# Patient Record
Sex: Female | Born: 1960 | Race: White | Hispanic: No | Marital: Married | Smoking: Never smoker
Health system: Southern US, Community
[De-identification: ages and names within clinical notes are randomized; demographics above are authoritative.]

## PROBLEM LIST (undated history)

## (undated) ENCOUNTER — Ambulatory Visit: Disposition: A | Discharge status: Home / Self Care | Payer: 59

## (undated) DIAGNOSIS — F419 Anxiety disorder, unspecified: Secondary | ICD-10-CM

## (undated) DIAGNOSIS — K219 Gastro-esophageal reflux disease without esophagitis: Secondary | ICD-10-CM

## (undated) DIAGNOSIS — L409 Psoriasis, unspecified: Secondary | ICD-10-CM

## (undated) DIAGNOSIS — M797 Fibromyalgia: Secondary | ICD-10-CM

## (undated) DIAGNOSIS — N939 Abnormal uterine and vaginal bleeding, unspecified: Secondary | ICD-10-CM

## (undated) DIAGNOSIS — R42 Dizziness and giddiness: Secondary | ICD-10-CM

## (undated) DIAGNOSIS — Z86018 Personal history of other benign neoplasm: Secondary | ICD-10-CM

## (undated) HISTORY — PX: TUBAL LIGATION: SHX77

## (undated) HISTORY — DX: Fibromyalgia: M79.7

## (undated) HISTORY — PX: DILATION AND CURETTAGE OF UTERUS: SHX78

## (undated) HISTORY — PX: WISDOM TOOTH EXTRACTION: SHX21

## (undated) HISTORY — PX: CHOLECYSTECTOMY: SHX55

## (undated) HISTORY — PX: HERNIA REPAIR: SHX51

## (undated) HISTORY — DX: Anxiety disorder, unspecified: F41.9

## (undated) HISTORY — DX: Abnormal uterine and vaginal bleeding, unspecified: N93.9

## (undated) HISTORY — DX: Psoriasis, unspecified: L40.9

## (undated) HISTORY — PX: TONSILLECTOMY: SUR1361

## (undated) HISTORY — PX: FOOT SURGERY: SHX648

## (undated) HISTORY — PX: BIOPSY ENDOMETRIAL: PRO11

## (undated) HISTORY — PX: ANKLE SURGERY: SHX546

## (undated) HISTORY — DX: Personal history of other benign neoplasm: Z86.018

## (undated) HISTORY — DX: Gastro-esophageal reflux disease without esophagitis: K21.9

---

## 2003-12-23 ENCOUNTER — Emergency Department: Payer: Self-pay | Admitting: Emergency Medicine

## 2005-10-13 IMAGING — CT CT ABD-PELV W/O CM
1 of 3 series · 16 of 32 positions shown, 20 images · non-contrast
Comparison: none

REASON FOR EXAM: (1) stone protocol  abd pain; (2) abd pain
COMMENTS:

[Series 3: inspace · axial · 0.59mm/px · z∈[+66,+460]mm · 16 of 615 slices shown, 20 images]
[im 26/615  soft-tissue]
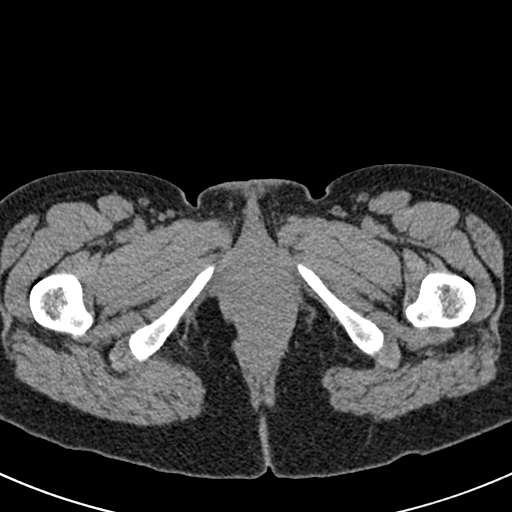
[im 26/615  bone]
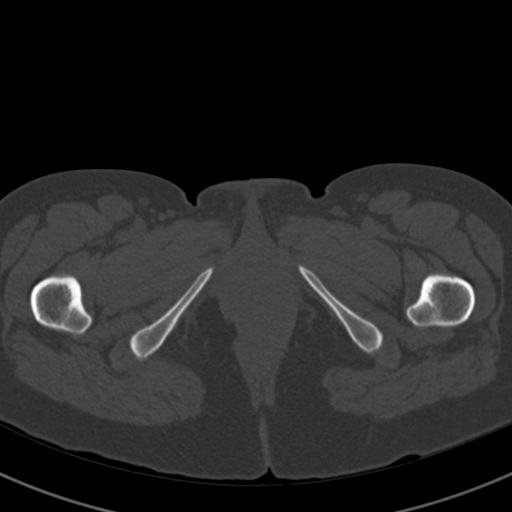
[im 77/615  soft-tissue]
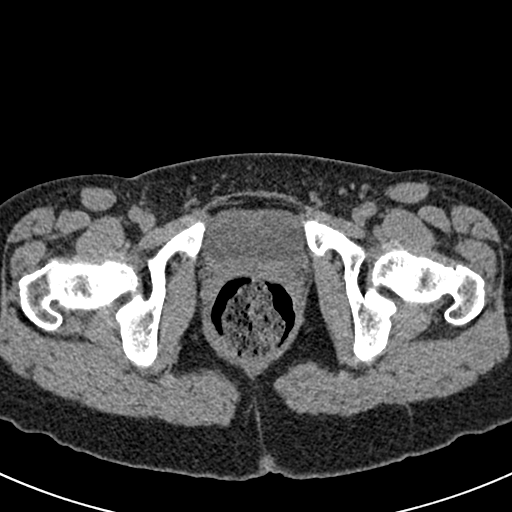
[im 128/615  soft-tissue]
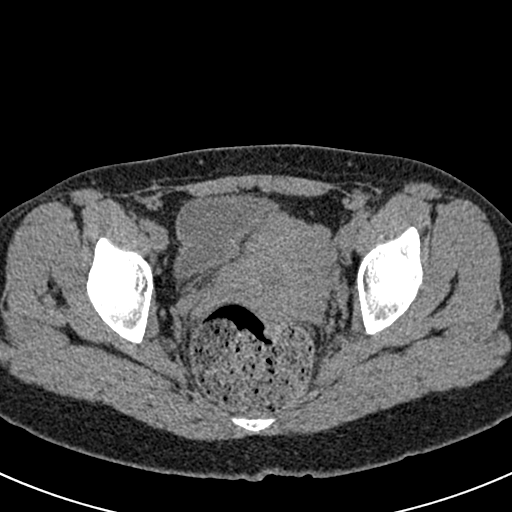
[im 154/615  soft-tissue]
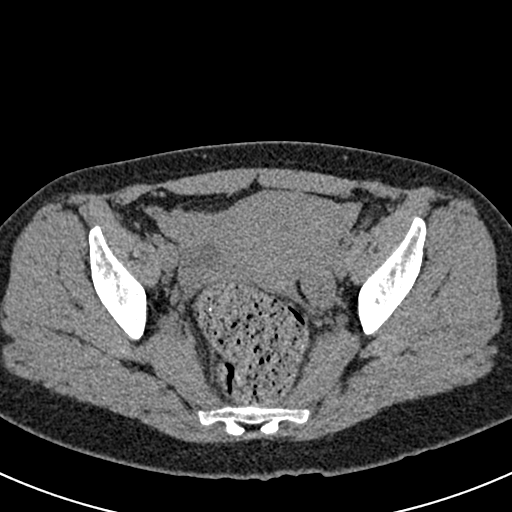
[im 205/615  soft-tissue]
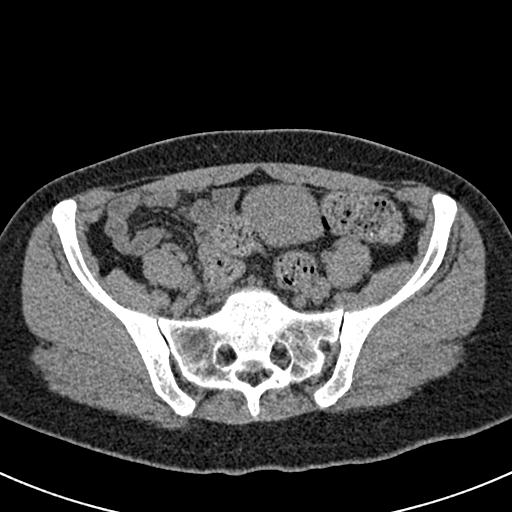
[im 256/615  soft-tissue]
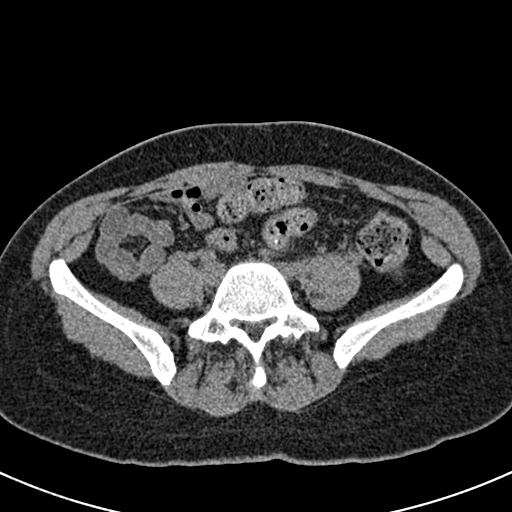
[im 282/615  soft-tissue]
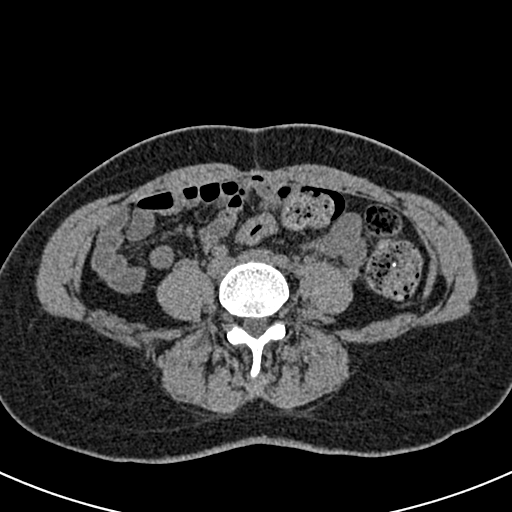
[im 333/615  soft-tissue]
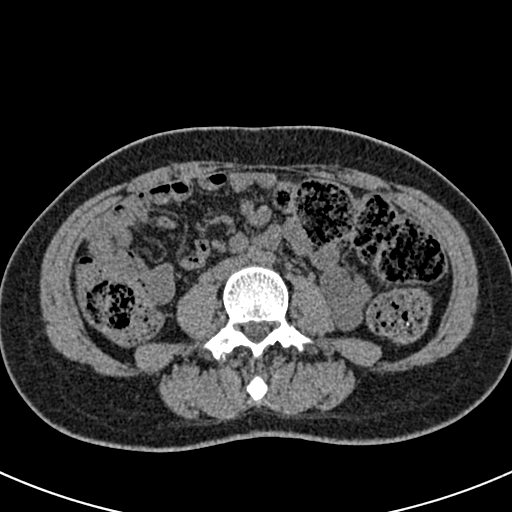
[im 359/615  soft-tissue]
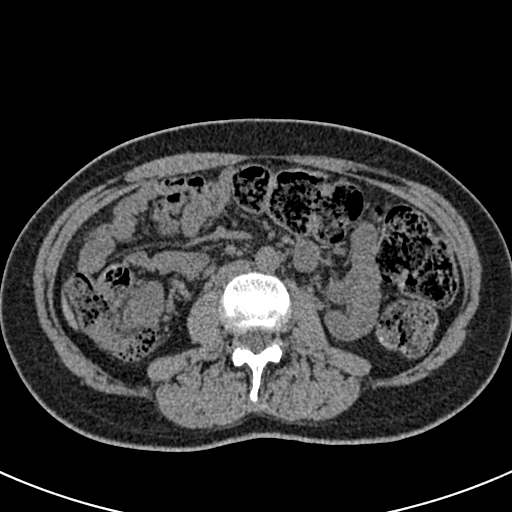
[im 359/615  bone]
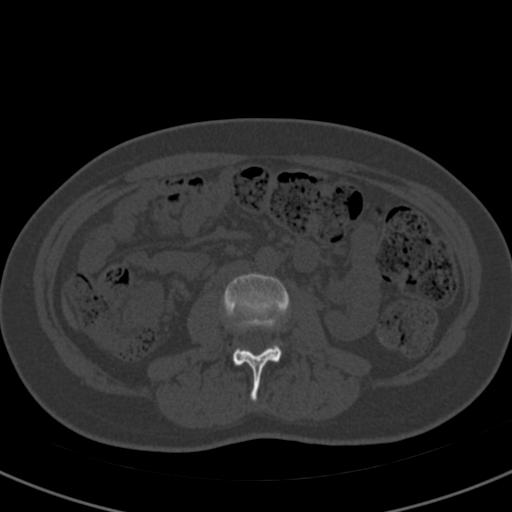
[im 410/615  soft-tissue]
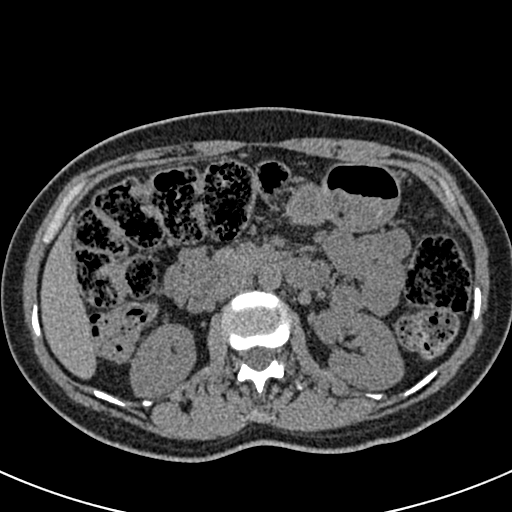
[im 461/615  soft-tissue]
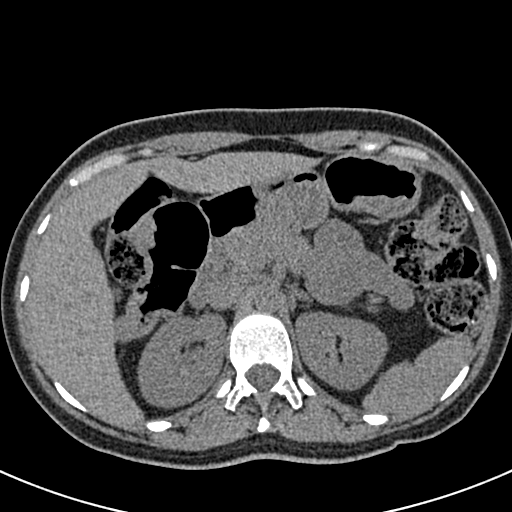
[im 487/615  soft-tissue]
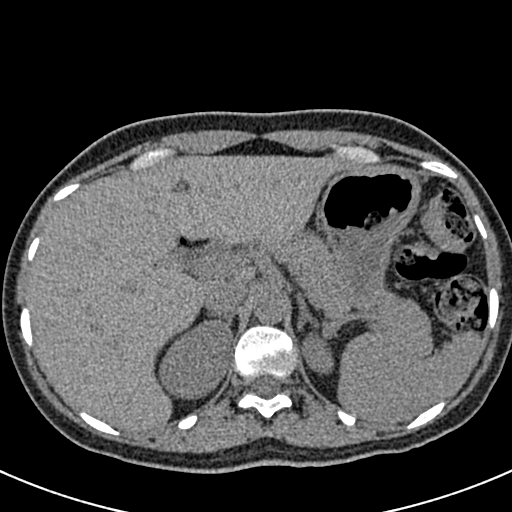
[im 512/615  lung]
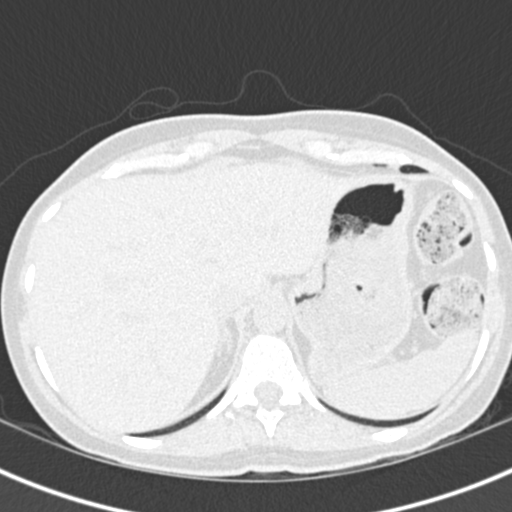
[im 538/615  soft-tissue]
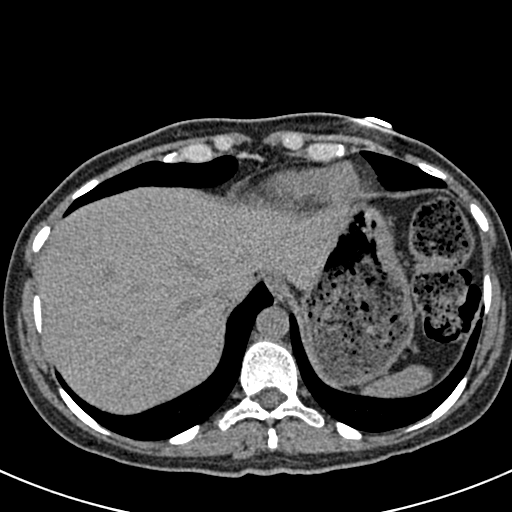
[im 538/615  lung]
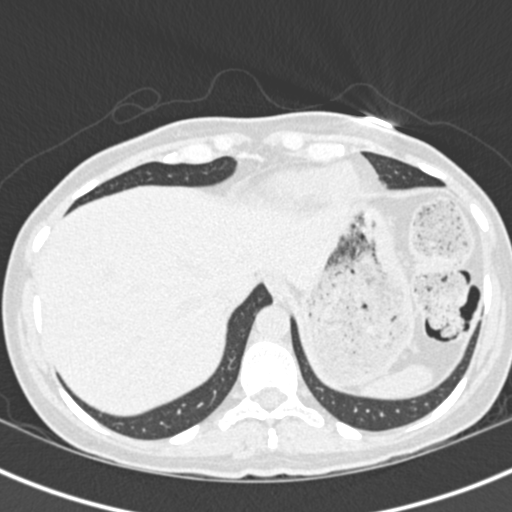
[im 563/615  lung]
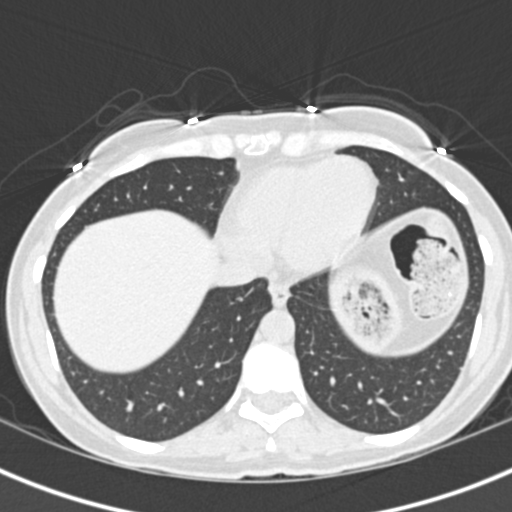
[im 589/615  soft-tissue]
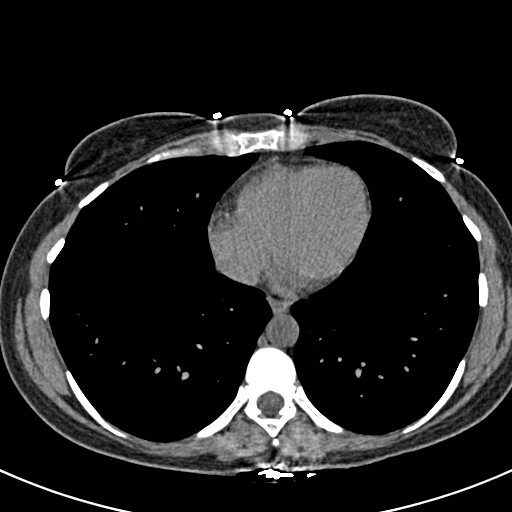
[im 589/615  lung]
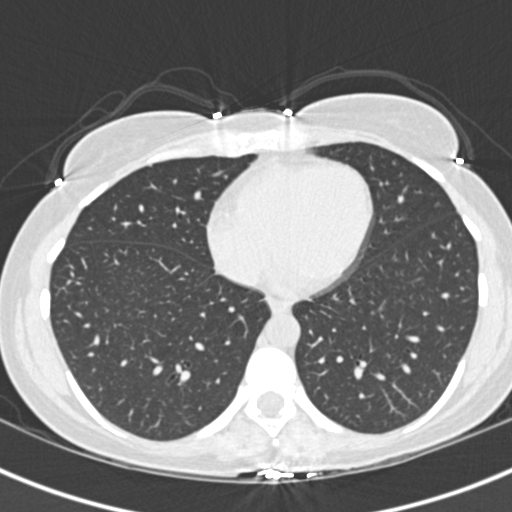

[16 of 32 positions shown; findings below may reference images not displayed]

PROCEDURE:     CT  - CT ABDOMEN AND PELVIS W[DATE]  [DATE]

RESULT:     Non-contrast emergent CT scan of the abdomen and pelvis is
performed. No radiopaque renal stones or obstructive changes are seen. There
is a moderate amount of retained stool and there may be an element of
constipation present. No definite obstruction is seen.  No free fluid or
free air is evident.  Phleboliths are seen within the pelvic region. The
appendix appears to be grossly normal.
IMPRESSION: 1)No urinary tract stones or obstructive change is seen.

2)Mild constipation.
The findings were phoned to the [HOSPITAL] the completion of
the study.

## 2006-08-04 ENCOUNTER — Ambulatory Visit: Payer: Self-pay | Admitting: Obstetrics & Gynecology

## 2006-08-04 ENCOUNTER — Encounter (INDEPENDENT_AMBULATORY_CARE_PROVIDER_SITE_OTHER): Payer: Self-pay | Admitting: Gynecology

## 2007-12-19 ENCOUNTER — Ambulatory Visit: Payer: Self-pay | Admitting: Nurse Practitioner

## 2007-12-19 ENCOUNTER — Encounter (INDEPENDENT_AMBULATORY_CARE_PROVIDER_SITE_OTHER): Payer: Self-pay | Admitting: Gynecology

## 2007-12-19 LAB — CONVERTED CEMR LAB
HCT: 44.1 % (ref 36.0–46.0)
HDL: 53 mg/dL (ref >39)
MCV: 97.8 fL (ref 78.0–100.0)
RBC: 4.51 M/uL (ref 3.87–5.11)
TSH: 1.869 microintl units/mL (ref 0.350–4.50)

## 2007-12-28 ENCOUNTER — Ambulatory Visit: Payer: Self-pay | Admitting: Family Medicine

## 2007-12-28 ENCOUNTER — Encounter: Payer: Self-pay | Admitting: Family Medicine

## 2009-02-22 DIAGNOSIS — Z86018 Personal history of other benign neoplasm: Secondary | ICD-10-CM

## 2009-02-22 HISTORY — DX: Personal history of other benign neoplasm: Z86.018

## 2009-03-12 ENCOUNTER — Ambulatory Visit: Payer: Self-pay | Admitting: Family Medicine

## 2009-03-12 LAB — CONVERTED CEMR LAB: TSH: 2.088 microintl units/mL (ref 0.350–4.500)

## 2009-03-20 ENCOUNTER — Ambulatory Visit (HOSPITAL_COMMUNITY): Admission: RE | Admit: 2009-03-20 | Discharge: 2009-03-20 | Payer: Self-pay | Admitting: Obstetrics & Gynecology

## 2009-04-09 ENCOUNTER — Ambulatory Visit: Payer: Self-pay | Admitting: Family Medicine

## 2009-11-05 ENCOUNTER — Ambulatory Visit: Payer: Self-pay | Admitting: Obstetrics and Gynecology

## 2010-07-07 NOTE — Assessment & Plan Note (Signed)
NAME:  Kathy Barnes, Kathy Barnes                 ACCOUNT NO.:  [id number]   MEDICAL RECORD NO.:  [id number]          PATIENT TYPE:  POB   LOCATION:  [facility]         FACILITY:  [facility]   PHYSICIAN:  Tinnie Gens, MD        DATE OF BIRTH:  11/03/1960   DATE OF SERVICE:  04/09/2009                                  CLINIC NOTE   CHIEF COMPLAINT:  Endometrial sampling.   HISTORY OF PRESENT ILLNESS:  The patient is a 50 year old gravida 4,  para 3-0-1-3, who came in for yearly exam in January.  She reported some  heavy bleeding at that time.  She underwent sonography which revealed a  small fibroid uterus, the biggest of which was 1.5 cm.  She also  underwent Pap smear and test which was normal and laboratory testing  which showed her to have a normal TSH and FSH.  She came in today to  discuss these results as well as to talk about endometrial sampling  which she was thinking may be due to some fibroids on ultrasound.  However, I do still think this is indicative given her age.  Additionally, the patient is complaining of fever and chills.  She saw  her primary care physician last week and had negative flu test and was  treated with flu meds, though she continues to have daily fevers,  chills, sinus headache and pressure, postnasal drip, and sore throat.  She thinks she has a sinus infection, multiple headaches with sinus  pressure seemed to point in that direction.   PHYSICAL EXAMINATION:  VITAL SIGNS:  Today, her vitals are as noted in  the chart.  GENERAL:  She is a well-developed, well-nourished female in no acute  distress.  ABDOMEN:  Soft, nontender, nondistended.  GU:  Normal external female genitalia.  BUS normal.  Vagina is pink and  rugated.  Cervix is parous without lesion.   PROCEDURE:  The cervix was grasped anteriorly with a single-tooth  tenaculum, it was cleaned with Betadine x2.  A Pipelle was used to sound  a 10-cm or 9.5 cm uterus.  Endometrial sampling was undertaken  without  difficulty.  The patient tolerated the procedure well.   IMPRESSION AND PLAN:  Abnormal uterine bleeding and acute sinusitis, Z-  Pak.  Followup results in 2 weeks.  The patient was also given  information about fibroid uterus and what if anything needs to be done  about that.           ______________________________  Tinnie Gens, MD     TP/MEDQ  D:  04/09/2009  T:  04/10/2009  Job:  [id number]

## 2010-07-07 NOTE — Assessment & Plan Note (Signed)
NAME:  Kathy Barnes, Kathy Barnes                 ACCOUNT NO.:  [id number]   MEDICAL RECORD NO.:  [id number]          PATIENT TYPE:  POB   LOCATION:  [facility]         FACILITY:  [facility]   PHYSICIAN:  Tinnie Gens, MD        DATE OF BIRTH:  1960/05/13   DATE OF SERVICE:  03/12/2009                                  CLINIC NOTE   CHIEF COMPLAINT:  Physical exam.   HISTORY OF PRESENT ILLNESS:  The patient is a 50 year old gravida 4,  para 3-0-1-3 status post tubal ligation who comes in today for annual  exam.  Her last mammogram was on February 11, 2009, and was BIRADS 1 and  negative.  She reports a 12-month history of heavier cycles with blood  clots of unclear etiology.  The cycles may be shorter in duration, but  are still regular.  She did miss her period approximately 2 months ago  and had 2 weeks of hot flashes, but those have resolved now.  Additionally, the patient has put on approximately 7 pounds, although  she has not been doing her spin classes regularly.  If she wants the  lessons she should increase her coffee drinks, which she is trying to  cut back on now.  The patient otherwise is without significant  complaint.   PAST MEDICAL HISTORY:  Fibromyalgia.   PAST SURGICAL HISTORY:  1. BTL.  2. Cholecystectomy.  3. Hernia repair.  4. Tonsillectomy.  5. Ankle surgery.   ALLERGIES:  None known.  No latex allergy.   MEDICATIONS:  Nortriptyline 25 mg p.o. daily.   OBSTETRICAL HISTORY:  G4, P3, SVD x3, SAB x1.   GYNECOLOGIC HISTORY:  History of BTL.  No history of abnormal Pap  smears.   FAMILY HISTORY:  Renal cancer, hypertension, diabetes, coronary artery  disease, and stroke.  She thinks her father had some sort of breast  tumor removed in his early 53s.  If this was a cancer, she would be a  candidate for BRCA testing.  She has no other family history of breast  or ovarian cancer.  Sister has a history of having had hysterectomy for  a large fibroid uterus.   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  She denies tobacco, alcohol, or drug use.   REVIEW OF SYSTEMS:  The patient denies headache, vision changes, chest  pain, shortness of breath, nausea, vomiting, diarrhea.  She is mildly  constipated which was unusual for her.  No blood in her stool.  No  problems with passing her urine.  GU, no vaginal irritation or discharge  noted.  The patient does have bleeding irregularities as described in  the HPI.   PHYSICAL EXAMINATION:  VITAL SIGNS:  Today, her pulse is 104, blood  pressure is 129/92, weight is 134.  GENERAL:  She is a well-developed, well-nourished female, in no acute  distress.  HEENT:  Normocephalic, atraumatic.  Sclerae anicteric.  NECK:  Supple.  Normal thyroid.  LUNGS:  Clear bilaterally.  CARDIOVASCULAR:  Regular rate and rhythm. No rubs, gallops, or murmurs.  ABDOMEN:  Soft,  nontender, nondistended.  EXTREMITIES:  No cyanosis, clubbing, or edema.  BREASTS:  Symmetric with everted nipples.  No masses.  No  supraclavicular or axillary adenopathy.  GU:  External genitalia, there is fusion of the labia minora in the  superior aspect for this patient.  She has no history of  infundibulation.  She has no erythema noted.  BUS is normal.  Vagina is  pink and rugated.  Cervix is parous without lesion.  Uterus is firm,  possibly retroverted, noted to be 8-10 week in size.  No adnexal masses  or tenderness noted.   IMPRESSION:  1. Yearly exam.  2. Abnormal uterine bleeding.   PLAN:  1. Pap smear today.  2. Check TSH and FSH.  3. Pelvic sonogram.  4. The patient will return in 2-4 weeks for endometrial samplings      given her abnormal uterine bleeding and for rule out of endometrial      hyperplasia or cancer.           ______________________________  Tinnie Gens, MD     TP/MEDQ  D:  03/12/2009  T:  03/13/2009  Job:  [id number]

## 2010-07-07 NOTE — Assessment & Plan Note (Signed)
NAME:  Kathy Barnes, Kathy Barnes                 ACCOUNT NO.:  [id number]   MEDICAL RECORD NO.:  [id number]          PATIENT TYPE:  POB   LOCATION:  [facility]         FACILITY:  [facility]   PHYSICIAN:  Tinnie Gens, MD        DATE OF BIRTH:  [birth year]   DATE OF SERVICE:  12/28/2007                                  CLINIC NOTE   CHIEF COMPLAINT:  Yearly exam.   HISTORY OF PRESENT ILLNESS:  The patient is a 50 year old gravida 4,  para 3-0-1-3 who is status post tubal ligation many years ago.  She  comes here for annual Pap smears.  Her last mammogram was in 2006.  She  continues to have regular cycles.  She is an identical twin.  Her sister  is having hysterectomy for a large fibroid uterus, and she is worried  about this, but she has not been having symptoms related to that.  The  patient has history of fibromyalgia.  She is on nortriptyline, which  controls her symptoms very well.   PAST MEDICAL HISTORY:  Significant for fibromyalgia.   PAST SURGICAL HISTORY:  BTL, cholecystectomy, hernia repair,  tonsillectomy, and ankle surgery.   ALLERGIES:  None known.  No latex allergy.   MEDICATIONS:  Nortriptyline 25 mg p.o. daily.   OBSTETRIC HISTORY:  She is a G4 with 1 SAB, and 3 SVDs.   GYNECOLOGIC HISTORY:  History of BTL.  No history of abnormal Pap  smears.   FAMILY HISTORY:  Significant for renal cancer, hypertension, diabetes,  coronary artery disease, and CVAs.   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  She denies tobacco, alcohol, or drug  use.   PHYSICAL EXAMINATION:  VITAL SIGNS:  Pulse is 100, blood pressure  124/89, weight 127, height 5 feet 2 inches.  GENERAL:  She is a well-developed, well-nourished female in no acute  distress.  HEENT:  Normocephalic, atraumatic.  Sclerae anicteric.  NECK:  Supple.  Normal thyroid.  LUNGS:  Clear bilaterally.  CARDIOVASCULAR:  Regular rate and rhythm.  No rubs, gallops, or  murmurs.  ABDOMEN:  Soft, nontender, nondistended.  BREASTS:  Symmetric with everted nipples.  No masses.  No  supraclavicular or axillary adenopathy.  GU:  Normal external female genitalia.  BUS normal.  Vagina, pink and  rugated.  Cervix is parous and without lesion.  Uterus is anteverted,  approximately 8 weeks' size.  No adnexal mass or tenderness.   LABORATORY DATA:  The patient had lab work done on December 19, 2007.  This is reviewed.  She has a WBC of 5.4, hemoglobin 14.7, platelet count  244, total cholesterol 188, HDL 53, LDL 113, TSH of 1.87.   IMPRESSION:  Yearly examination, doing well.   PLAN:  1. Pap smear today.  2. Referral for mammogram.  3. Colonoscopy in 2 years.           ______________________________  Tinnie Gens, MD     TP/MEDQ  D:  12/28/2007  T:  12/29/2007  Job:  [id number]

## 2010-07-07 NOTE — Assessment & Plan Note (Signed)
NAME:  MIRANDA, FRESE NO.:  [id number]   MEDICAL RECORD NO.:  [id number]          PATIENT TYPE:  POB   LOCATION:  [facility]         FACILITY:  [facility]   PHYSICIAN:  Argentina Donovan, MD        DATE OF BIRTH:  [birth year]   DATE OF SERVICE:  11/05/2009                                  CLINIC NOTE   The patient is a 50 year old Caucasian female, gravida 4, para 3-0-1-3,  who had her yearly exam earlier in the year.  She came in because of  nodules that she felt axillary and also in the area of the labia majora.  Also, she stated she has got some small red raised areas on the vulva.  The patient on examination showed areas that had been shaved both in the  axilla and the labia and had a small subcutaneous nodule close to the  skin in those areas probably sebaceous cysts.  Also, she had 2 small  elevated vesicles of 1-mm in diameter that appeared to be just dilated.  Capillaries were asymptomatic in the area that she had been shaving.  We  talked about shaving and injury to the skin that it causes and that  these things are often secondary to that as are sometimes subcutaneous  abscesses in the sweat glands.  I think she felt comfortable with that.  She also is markedly irregular in her periods now every 2-3 months as  she approaches menopause and often times has hot flashes and symptoms  for awhile and then they disappear and following that her period comes  on she is having certainly irregular ovulations.   IMPRESSION:  Perimenopausal with small benign subcutaneous nodules  probably secondary to her shaving.           ______________________________  Argentina Donovan, MD     PR/MEDQ  D:  11/05/2009  T:  11/06/2009  Job:  [id number]

## 2011-01-09 IMAGING — US US TRANSVAGINAL NON-OB
1 series · 13 of 25 positions shown · non-contrast
Comparison: None.

CLINICAL DATA: Abnormal uterine bleeding with menses becoming
irregular and sometimes heavy.  LMP 03/07/2009.

TRANSABDOMINAL AND TRANSVAGINAL ULTRASOUND OF PELVIS
TECHNIQUE: Both transabdominal and transvaginal ultrasound
examinations of the pelvis were performed including evaluation of
the uterus, ovaries, adnexal regions, and pelvic cul-de-sac.

[Series 1: us transvaginal non-ob · 13 of 55 slices shown]
[im 1/55]
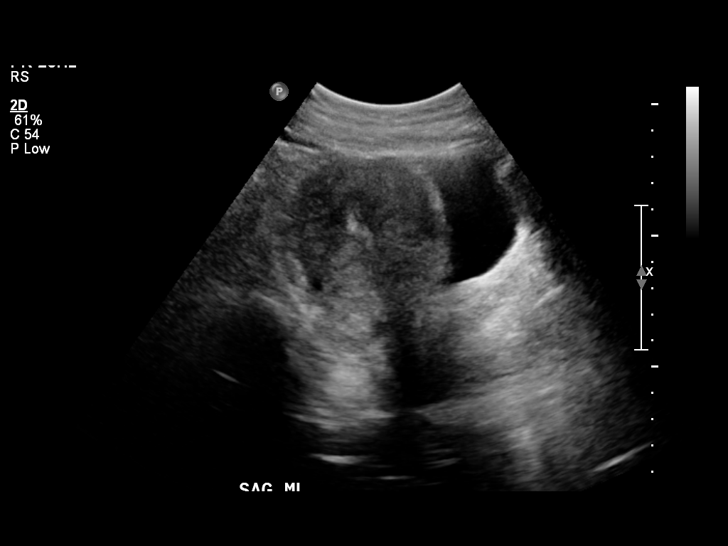
[im 5/55]
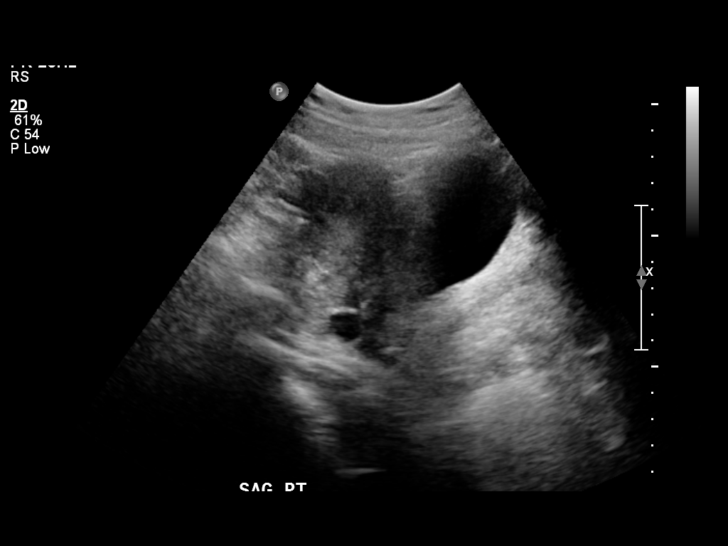
[im 10/55]
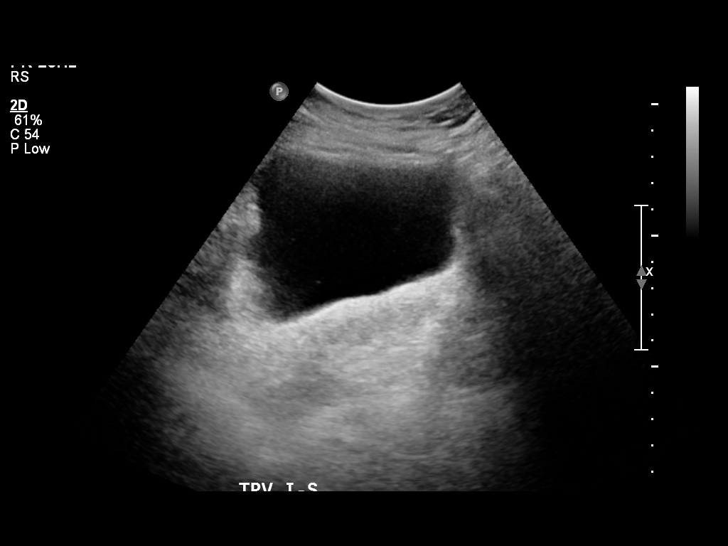
[im 14/55]
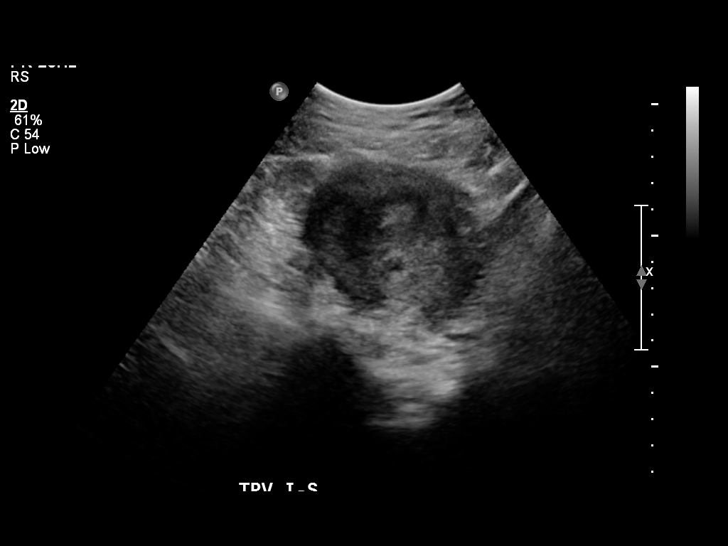
[im 19/55]
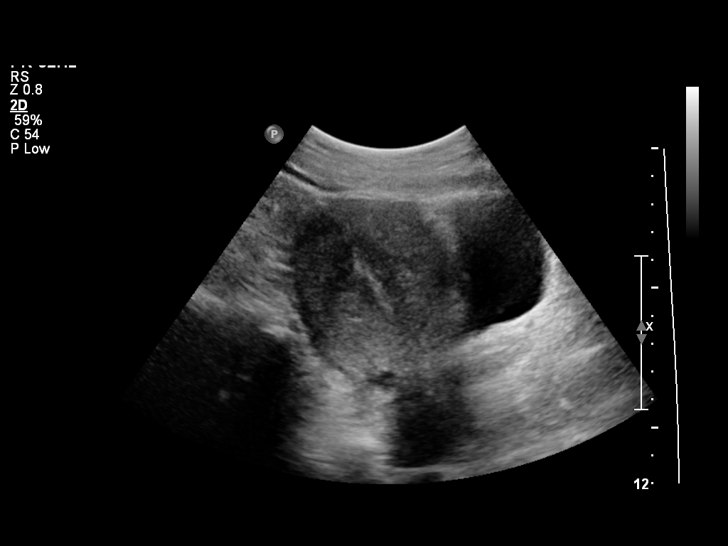
[im 23/55]
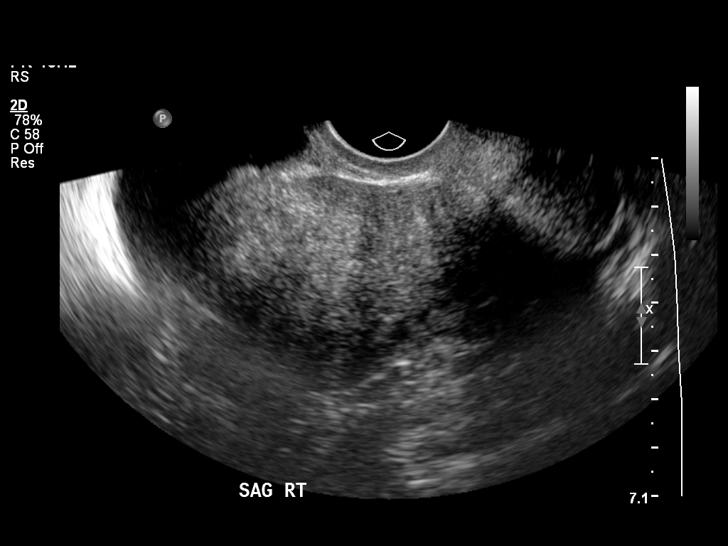
[im 28/55]
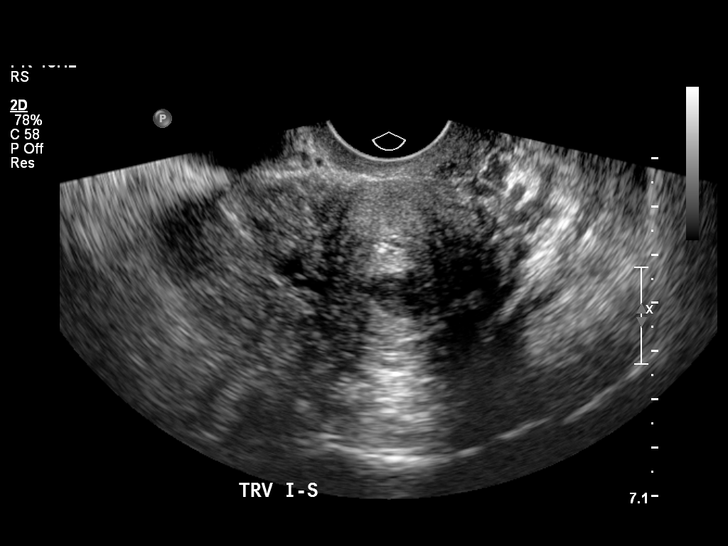
[im 32/55]
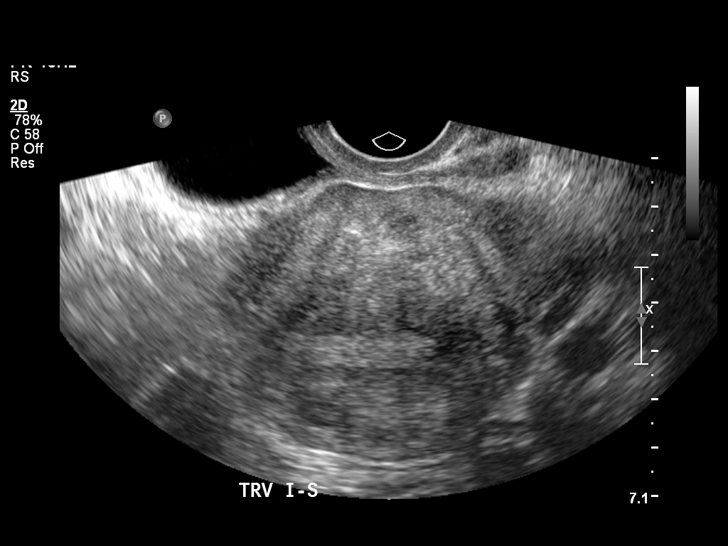
[im 37/55]
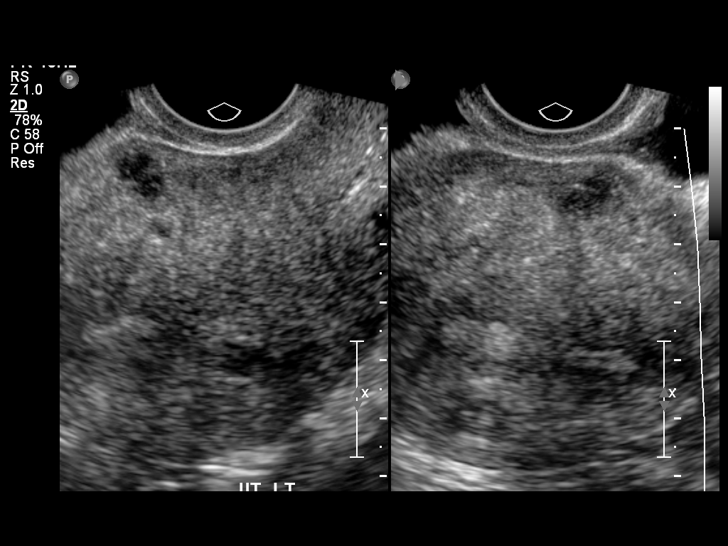
[im 41/55]
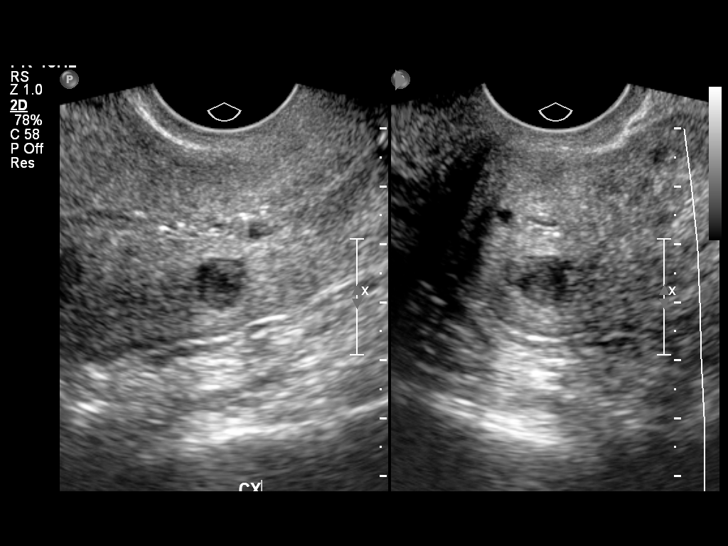
[im 46/55]
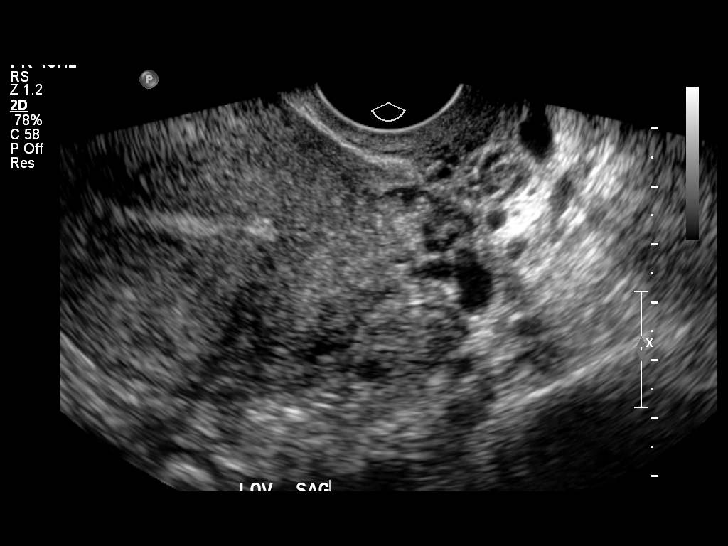
[im 50/55]
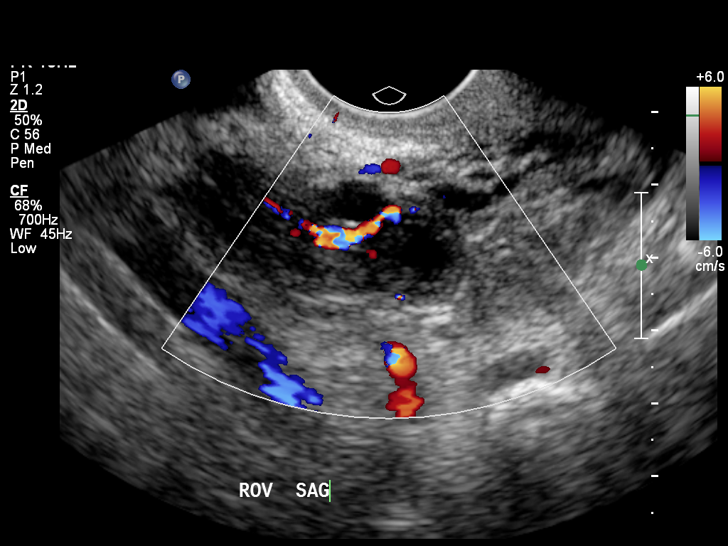
[im 55/55]
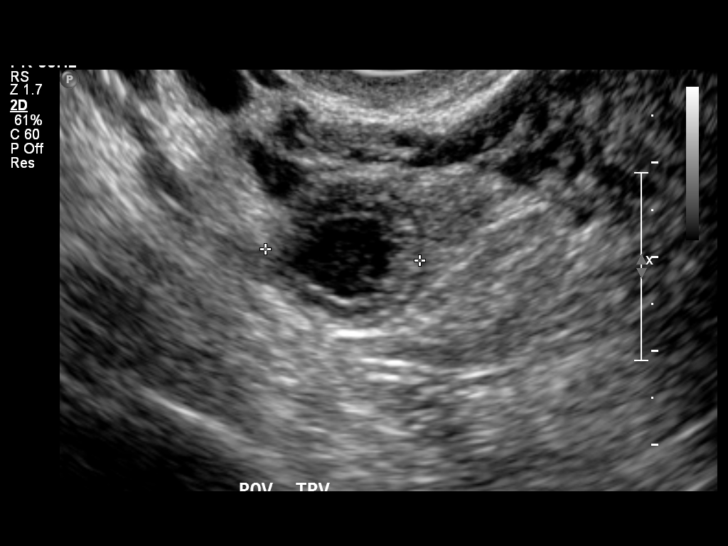

[13 of 25 positions shown; findings below may reference images not displayed]

FINDINGS: Uterus the uterus demonstrates a sagittal length of 9.8 cm, an AP
width of 5.5 cm and a transverse width of 6.8 cm.  There are
several small focal mural fibroids identified measuring between one
and 1.5 cm.  One of these is located in the posterior cervical
region.

Endometrium  is homogeneously echogenic with an AP width of 6.2 mm.
No areas of focal thickening or inhomogeneity are seen.  A tri-
layered appearance is not seen as might be expected with a
periovulatory endometrial stripe and the patient's given LMP of
03/07/2009.

Right Ovary measures 3.4 x 1.7 x 1.9 cm and contains a crenated
small complex cystic area with peripheral flow.  This appearance is
compatible with a corpus luteum

Left Ovary measures 3.4 x 1.7 x 1.9 cm and has a normal appearance

Other Findings:  No pelvic fluid or separate adnexal masses are
seen.
IMPRESSION: Small mural fibroids and one small cervical fibroid noted.
Homogeneously echogenic endometrial stripe with no focal
abnormality seen.  Findings suggestive of a right corpus luteum.
Normal left ovary.  Overall the appearance would be more compatible
with a presecretory pelvic ultrasound than a periovulatory pelvic
ultrasound as might be anticipated with the patient's given LMP of
[DATE].

## 2011-12-23 ENCOUNTER — Other Ambulatory Visit (INDEPENDENT_AMBULATORY_CARE_PROVIDER_SITE_OTHER): Payer: Self-pay | Admitting: *Deleted

## 2011-12-23 DIAGNOSIS — Z23 Encounter for immunization: Secondary | ICD-10-CM

## 2011-12-23 DIAGNOSIS — N912 Amenorrhea, unspecified: Secondary | ICD-10-CM

## 2011-12-23 LAB — CBC WITH DIFFERENTIAL/PLATELET
Basophils Absolute: 0 10*3/uL (ref 0.0–0.1)
Basophils Relative: 1 % (ref 0–1)
Eosinophils Absolute: 0.1 10*3/uL (ref 0.0–0.7)
Eosinophils Relative: 2 % (ref 0–5)
HCT: 41.3 % (ref 36.0–46.0)
MCHC: 34.4 g/dL (ref 30.0–36.0)
Neutro Abs: 3 10*3/uL (ref 1.7–7.7)
RDW: 12.5 % (ref 11.5–15.5)
WBC: 5.5 10*3/uL (ref 4.0–10.5)

## 2011-12-23 NOTE — Progress Notes (Signed)
Patient is here for fasting labs, fsh, and flu shot.

## 2011-12-24 LAB — COMPREHENSIVE METABOLIC PANEL
ALT: 12 U/L (ref 0–35)
AST: 14 U/L (ref 0–37)
Albumin: 4.4 g/dL (ref 3.5–5.2)
Chloride: 103 mEq/L (ref 96–112)
Creat: 0.78 mg/dL (ref 0.50–1.10)
Sodium: 138 mEq/L (ref 135–145)
Total Bilirubin: 0.4 mg/dL (ref 0.3–1.2)
Total Protein: 6.6 g/dL (ref 6.0–8.3)

## 2011-12-24 LAB — LIPID PANEL
HDL: 53 mg/dL (ref >39)
LDL Cholesterol: 168 mg/dL — ABNORMAL HIGH (ref 0–99)

## 2011-12-31 ENCOUNTER — Ambulatory Visit (INDEPENDENT_AMBULATORY_CARE_PROVIDER_SITE_OTHER): Payer: 59 | Admitting: Family Medicine

## 2011-12-31 ENCOUNTER — Encounter: Payer: Self-pay | Admitting: Family Medicine

## 2011-12-31 VITALS — BP 119/86 | HR 100 | Ht 62.0 in | Wt 139.0 lb

## 2011-12-31 DIAGNOSIS — Z01419 Encounter for gynecological examination (general) (routine) without abnormal findings: Secondary | ICD-10-CM

## 2011-12-31 DIAGNOSIS — L409 Psoriasis, unspecified: Secondary | ICD-10-CM | POA: Insufficient documentation

## 2011-12-31 DIAGNOSIS — Z124 Encounter for screening for malignant neoplasm of cervix: Secondary | ICD-10-CM

## 2011-12-31 DIAGNOSIS — M797 Fibromyalgia: Secondary | ICD-10-CM | POA: Insufficient documentation

## 2011-12-31 DIAGNOSIS — Z1151 Encounter for screening for human papillomavirus (HPV): Secondary | ICD-10-CM

## 2011-12-31 NOTE — Patient Instructions (Signed)
Preventive Care for Adults, Female A healthy lifestyle and preventive care can promote health and wellness. Preventive health guidelines for women include the following key practices.  A routine yearly physical is a good way to check with your caregiver about your health and preventive screening. It is a chance to share any concerns and updates on your health, and to receive a thorough exam.  Visit your dentist for a routine exam and preventive care every 6 months. Brush your teeth twice a day and floss once a day. Good oral hygiene prevents tooth decay and gum disease.  The frequency of eye exams is based on your age, health, family medical history, use of contact lenses, and other factors. Follow your caregiver's recommendations for frequency of eye exams.  Eat a healthy diet. Foods like vegetables, fruits, whole grains, low-fat dairy products, and lean protein foods contain the nutrients you need without too many calories. Decrease your intake of foods high in solid fats, added sugars, and salt. Eat the right amount of calories for you.Get information about a proper diet from your caregiver, if necessary.  Regular physical exercise is one of the most important things you can do for your health. Most adults should get at least 150 minutes of moderate-intensity exercise (any activity that increases your heart rate and causes you to sweat) each week. In addition, most adults need muscle-strengthening exercises on 2 or more days a week.  Maintain a healthy weight. The body mass index (BMI) is a screening tool to identify possible weight problems. It provides an estimate of body fat based on height and weight. Your caregiver can help determine your BMI, and can help you achieve or maintain a healthy weight.For adults 20 years and older:  A BMI below 18.5 is considered underweight.  A BMI of 18.5 to 24.9 is normal.  A BMI of 25 to 29.9 is considered overweight.  A BMI of 30 and above is  considered obese.  Maintain normal blood lipids and cholesterol levels by exercising and minimizing your intake of saturated fat. Eat a balanced diet with plenty of fruit and vegetables. Blood tests for lipids and cholesterol should begin at age 20 and be repeated every 5 years. If your lipid or cholesterol levels are high, you are over 50, or you are at high risk for heart disease, you may need your cholesterol levels checked more frequently.Ongoing high lipid and cholesterol levels should be treated with medicines if diet and exercise are not effective.  If you smoke, find out from your caregiver how to quit. If you do not use tobacco, do not start.  If you are pregnant, do not drink alcohol. If you are breastfeeding, be very cautious about drinking alcohol. If you are not pregnant and choose to drink alcohol, do not exceed 1 drink per day. One drink is considered to be 12 ounces (355 mL) of beer, 5 ounces (148 mL) of wine, or 1.5 ounces (44 mL) of liquor.  Avoid use of street drugs. Do not share needles with anyone. Ask for help if you need support or instructions about stopping the use of drugs.  High blood pressure causes heart disease and increases the risk of stroke. Your blood pressure should be checked at least every 1 to 2 years. Ongoing high blood pressure should be treated with medicines if weight loss and exercise are not effective.  If you are 55 to 52 years old, ask your caregiver if you should take aspirin to prevent strokes.  Diabetes   screening involves taking a blood sample to check your fasting blood sugar level. This should be done once every 3 years, after age 45, if you are within normal weight and without risk factors for diabetes. Testing should be considered at a younger age or be carried out more frequently if you are overweight and have at least 1 risk factor for diabetes.  Breast cancer screening is essential preventive care for women. You should practice "breast  self-awareness." This means understanding the normal appearance and feel of your breasts and may include breast self-examination. Any changes detected, no matter how small, should be reported to a caregiver. Women in their 20s and 30s should have a clinical breast exam (CBE) by a caregiver as part of a regular health exam every 1 to 3 years. After age 40, women should have a CBE every year. Starting at age 40, women should consider having a mammography (breast X-ray test) every year. Women who have a family history of breast cancer should talk to their caregiver about genetic screening. Women at a high risk of breast cancer should talk to their caregivers about having magnetic resonance imaging (MRI) and a mammography every year.  The Pap test is a screening test for cervical cancer. A Pap test can show cell changes on the cervix that might become cervical cancer if left untreated. A Pap test is a procedure in which cells are obtained and examined from the lower end of the uterus (cervix).  Women should have a Pap test starting at age 21.  Between ages 21 and 29, Pap tests should be repeated every 2 years.  Beginning at age 30, you should have a Pap test every 3 years as long as the past 3 Pap tests have been normal.  Some women have medical problems that increase the chance of getting cervical cancer. Talk to your caregiver about these problems. It is especially important to talk to your caregiver if a new problem develops soon after your last Pap test. In these cases, your caregiver may recommend more frequent screening and Pap tests.  The above recommendations are the same for women who have or have not gotten the vaccine for human papillomavirus (HPV).  If you had a hysterectomy for a problem that was not cancer or a condition that could lead to cancer, then you no longer need Pap tests. Even if you no longer need a Pap test, a regular exam is a good idea to make sure no other problems are  starting.  If you are between ages 65 and 70, and you have had normal Pap tests going back 10 years, you no longer need Pap tests. Even if you no longer need a Pap test, a regular exam is a good idea to make sure no other problems are starting.  If you have had past treatment for cervical cancer or a condition that could lead to cancer, you need Pap tests and screening for cancer for at least 20 years after your treatment.  If Pap tests have been discontinued, risk factors (such as a new sexual partner) need to be reassessed to determine if screening should be resumed.  The HPV test is an additional test that may be used for cervical cancer screening. The HPV test looks for the virus that can cause the cell changes on the cervix. The cells collected during the Pap test can be tested for HPV. The HPV test could be used to screen women aged 30 years and older, and should   be used in women of any age who have unclear Pap test results. After the age of 30, women should have HPV testing at the same frequency as a Pap test.  Colorectal cancer can be detected and often prevented. Most routine colorectal cancer screening begins at the age of 50 and continues through age 75. However, your caregiver may recommend screening at an earlier age if you have risk factors for colon cancer. On a yearly basis, your caregiver may provide home test kits to check for hidden blood in the stool. Use of a small camera at the end of a tube, to directly examine the colon (sigmoidoscopy or colonoscopy), can detect the earliest forms of colorectal cancer. Talk to your caregiver about this at age 50, when routine screening begins. Direct examination of the colon should be repeated every 5 to 10 years through age 75, unless early forms of pre-cancerous polyps or small growths are found.  Hepatitis C blood testing is recommended for all people born from 1945 through 1965 and any individual with known risks for hepatitis C.  Practice  safe sex. Use condoms and avoid high-risk sexual practices to reduce the spread of sexually transmitted infections (STIs). STIs include gonorrhea, chlamydia, syphilis, trichomonas, herpes, HPV, and human immunodeficiency virus (HIV). Herpes, HIV, and HPV are viral illnesses that have no cure. They can result in disability, cancer, and death. Sexually active women aged 25 and younger should be checked for chlamydia. Older women with new or multiple partners should also be tested for chlamydia. Testing for other STIs is recommended if you are sexually active and at increased risk.  Osteoporosis is a disease in which the bones lose minerals and strength with aging. This can result in serious bone fractures. The risk of osteoporosis can be identified using a bone density scan. Women ages 65 and over and women at risk for fractures or osteoporosis should discuss screening with their caregivers. Ask your caregiver whether you should take a calcium supplement or vitamin D to reduce the rate of osteoporosis.  Menopause can be associated with physical symptoms and risks. Hormone replacement therapy is available to decrease symptoms and risks. You should talk to your caregiver about whether hormone replacement therapy is right for you.  Use sunscreen with sun protection factor (SPF) of 30 or more. Apply sunscreen liberally and repeatedly throughout the day. You should seek shade when your shadow is shorter than you. Protect yourself by wearing long sleeves, pants, a wide-brimmed hat, and sunglasses year round, whenever you are outdoors.  Once a month, do a whole body skin exam, using a mirror to look at the skin on your back. Notify your caregiver of new moles, moles that have irregular borders, moles that are larger than a pencil eraser, or moles that have changed in shape or color.  Stay current with required immunizations.  Influenza. You need a dose every fall (or winter). The composition of the flu vaccine  changes each year, so being vaccinated once is not enough.  Pneumococcal polysaccharide. You need 1 to 2 doses if you smoke cigarettes or if you have certain chronic medical conditions. You need 1 dose at age 65 (or older) if you have never been vaccinated.  Tetanus, diphtheria, pertussis (Tdap, Td). Get 1 dose of Tdap vaccine if you are younger than age 65, are over 65 and have contact with an infant, are a healthcare worker, are pregnant, or simply want to be protected from whooping cough. After that, you need a Td   booster dose every 10 years. Consult your caregiver if you have not had at least 3 tetanus and diphtheria-containing shots sometime in your life or have a deep or dirty wound.  HPV. You need this vaccine if you are a woman age 26 or younger. The vaccine is given in 3 doses over 6 months.  Measles, mumps, rubella (MMR). You need at least 1 dose of MMR if you were born in 1957 or later. You may also need a second dose.  Meningococcal. If you are age 19 to 21 and a first-year college student living in a residence hall, or have one of several medical conditions, you need to get vaccinated against meningococcal disease. You may also need additional booster doses.  Zoster (shingles). If you are age 60 or older, you should get this vaccine.  Varicella (chickenpox). If you have never had chickenpox or you were vaccinated but received only 1 dose, talk to your caregiver to find out if you need this vaccine.  Hepatitis A. You need this vaccine if you have a specific risk factor for hepatitis A virus infection or you simply wish to be protected from this disease. The vaccine is usually given as 2 doses, 6 to 18 months apart.  Hepatitis B. You need this vaccine if you have a specific risk factor for hepatitis B virus infection or you simply wish to be protected from this disease. The vaccine is given in 3 doses, usually over 6 months. Preventive Services / Frequency Ages 19 to 39  Blood  pressure check.** / Every 1 to 2 years.  Lipid and cholesterol check.** / Every 5 years beginning at age 20.  Clinical breast exam.** / Every 3 years for women in their 20s and 30s.  Pap test.** / Every 2 years from ages 21 through 29. Every 3 years starting at age 30 through age 65 or 70 with a history of 3 consecutive normal Pap tests.  HPV screening.** / Every 3 years from ages 30 through ages 65 to 70 with a history of 3 consecutive normal Pap tests.  Hepatitis C blood test.** / For any individual with known risks for hepatitis C.  Skin self-exam. / Monthly.  Influenza immunization.** / Every year.  Pneumococcal polysaccharide immunization.** / 1 to 2 doses if you smoke cigarettes or if you have certain chronic medical conditions.  Tetanus, diphtheria, pertussis (Tdap, Td) immunization. / A one-time dose of Tdap vaccine. After that, you need a Td booster dose every 10 years.  HPV immunization. / 3 doses over 6 months, if you are 26 and younger.  Measles, mumps, rubella (MMR) immunization. / You need at least 1 dose of MMR if you were born in 1957 or later. You may also need a second dose.  Meningococcal immunization. / 1 dose if you are age 19 to 21 and a first-year college student living in a residence hall, or have one of several medical conditions, you need to get vaccinated against meningococcal disease. You may also need additional booster doses.  Varicella immunization.** / Consult your caregiver.  Hepatitis A immunization.** / Consult your caregiver. 2 doses, 6 to 18 months apart.  Hepatitis B immunization.** / Consult your caregiver. 3 doses usually over 6 months. Ages 40 to 64  Blood pressure check.** / Every 1 to 2 years.  Lipid and cholesterol check.** / Every 5 years beginning at age 20.  Clinical breast exam.** / Every year after age 40.  Mammogram.** / Every year beginning at age 40   and continuing for as long as you are in good health. Consult with your  caregiver.  Pap test.** / Every 3 years starting at age 30 through age 65 or 70 with a history of 3 consecutive normal Pap tests.  HPV screening.** / Every 3 years from ages 30 through ages 65 to 70 with a history of 3 consecutive normal Pap tests.  Fecal occult blood test (FOBT) of stool. / Every year beginning at age 50 and continuing until age 75. You may not need to do this test if you get a colonoscopy every 10 years.  Flexible sigmoidoscopy or colonoscopy.** / Every 5 years for a flexible sigmoidoscopy or every 10 years for a colonoscopy beginning at age 50 and continuing until age 75.  Hepatitis C blood test.** / For all people born from 1945 through 1965 and any individual with known risks for hepatitis C.  Skin self-exam. / Monthly.  Influenza immunization.** / Every year.  Pneumococcal polysaccharide immunization.** / 1 to 2 doses if you smoke cigarettes or if you have certain chronic medical conditions.  Tetanus, diphtheria, pertussis (Tdap, Td) immunization.** / A one-time dose of Tdap vaccine. After that, you need a Td booster dose every 10 years.  Measles, mumps, rubella (MMR) immunization. / You need at least 1 dose of MMR if you were born in 1957 or later. You may also need a second dose.  Varicella immunization.** / Consult your caregiver.  Meningococcal immunization.** / Consult your caregiver.  Hepatitis A immunization.** / Consult your caregiver. 2 doses, 6 to 18 months apart.  Hepatitis B immunization.** / Consult your caregiver. 3 doses, usually over 6 months. Ages 65 and over  Blood pressure check.** / Every 1 to 2 years.  Lipid and cholesterol check.** / Every 5 years beginning at age 20.  Clinical breast exam.** / Every year after age 40.  Mammogram.** / Every year beginning at age 40 and continuing for as long as you are in good health. Consult with your caregiver.  Pap test.** / Every 3 years starting at age 30 through age 65 or 70 with a 3  consecutive normal Pap tests. Testing can be stopped between 65 and 70 with 3 consecutive normal Pap tests and no abnormal Pap or HPV tests in the past 10 years.  HPV screening.** / Every 3 years from ages 30 through ages 65 or 70 with a history of 3 consecutive normal Pap tests. Testing can be stopped between 65 and 70 with 3 consecutive normal Pap tests and no abnormal Pap or HPV tests in the past 10 years.  Fecal occult blood test (FOBT) of stool. / Every year beginning at age 50 and continuing until age 75. You may not need to do this test if you get a colonoscopy every 10 years.  Flexible sigmoidoscopy or colonoscopy.** / Every 5 years for a flexible sigmoidoscopy or every 10 years for a colonoscopy beginning at age 50 and continuing until age 75.  Hepatitis C blood test.** / For all people born from 1945 through 1965 and any individual with known risks for hepatitis C.  Osteoporosis screening.** / A one-time screening for women ages 65 and over and women at risk for fractures or osteoporosis.  Skin self-exam. / Monthly.  Influenza immunization.** / Every year.  Pneumococcal polysaccharide immunization.** / 1 dose at age 65 (or older) if you have never been vaccinated.  Tetanus, diphtheria, pertussis (Tdap, Td) immunization. / A one-time dose of Tdap vaccine if you are over   65 and have contact with an infant, are a healthcare worker, or simply want to be protected from whooping cough. After that, you need a Td booster dose every 10 years.  Varicella immunization.** / Consult your caregiver.  Meningococcal immunization.** / Consult your caregiver.  Hepatitis A immunization.** / Consult your caregiver. 2 doses, 6 to 18 months apart.  Hepatitis B immunization.** / Check with your caregiver. 3 doses, usually over 6 months. ** Family history and personal history of risk and conditions may change your caregiver's recommendations. Document Released: 04/06/2001 Document Revised: 05/03/2011  Document Reviewed: 07/06/2010 [employer] Patient Information 2013 [employer].  

## 2011-12-31 NOTE — Progress Notes (Signed)
  Subjective:     Kathy Barnes is a 51 y.o. female and is here for a comprehensive physical exam. The patient reports no problems.  Has gone through menopause and denies issues.  Two years since her last visit.  She is reporting some sleep disturbance.  Reports awakening and sensing that she forgot to breathe.  Her husband denies that she is snoring or stops breathing in the night.  She denies significant daytime drowsiness or nodding off.  History   Social History  . Marital Status: Married    Spouse Name: N/A    Number of Children: N/A  . Years of Education: N/A   Occupational History  . Not on file.   Social History Main Topics  . Smoking status: Never Smoker   . Smokeless tobacco: Not on file  . Alcohol Use: No  . Drug Use: No  . Sexually Active: Yes -- Female partner(s)    Birth Control/ Protection: Post-menopausal   Other Topics Concern  . Not on file   Social History Narrative  . No narrative on file   Health Maintenance  Topic Date Due  . Pap Smear  02/05/1978  . Tetanus/tdap  02/06/1979  . Mammogram  02/05/2010  . Colonoscopy  02/05/2010  . Influenza Vaccine  10/23/2012       Review of Systems A comprehensive review of systems was negative.   Objective:    BP 119/86  Pulse 100  Ht 5\' 2"  (1.575 m)  Wt 139 lb (63.05 kg)  BMI 25.42 kg/m2  LMP 07/23/2009 General appearance: alert, cooperative and appears stated age Head: Normocephalic, without obvious abnormality, atraumatic Neck: no adenopathy, supple, symmetrical, trachea midline and thyroid not enlarged, symmetric, no tenderness/mass/nodules Lungs: clear to auscultation bilaterally Breasts: Inspection negative, No nipple retraction or dimpling, negative findings: no palpable axillary lymphadenopathy, positive findings: fibrocystic changes on right Heart: regular rate and rhythm, S1, S2 normal, no murmur, click, rub or gallop Abdomen: soft, non-tender; bowel sounds normal; no masses,  no  organomegaly Pelvic: cervix normal in appearance, external genitalia normal, no adnexal masses or tenderness, no cervical motion tenderness, uterus normal size, shape, and consistency and vagina normal without discharge Extremities: extremities normal, atraumatic, no cyanosis or edema Pulses: 2+ and symmetric Skin: Skin color, texture, turgor normal. No rashes or lesions Lymph nodes: Cervical, supraclavicular, and axillary nodes normal. Neurologic: Grossly normal    Assessment:    Healthy female exam. Menopausal. Elevated cholesterol on blood work from last week.     Plan:    Advised low chol. Diet, increase exercise and repeat cholesterol in 6 mos. Pap smear today. Mammogram. Colonoscopy. See After Visit Summary for Counseling Recommendations

## 2012-02-21 ENCOUNTER — Encounter: Payer: Self-pay | Admitting: Family Medicine

## 2013-09-28 ENCOUNTER — Ambulatory Visit: Payer: Self-pay | Admitting: Family Medicine

## 2013-09-28 LAB — BASIC METABOLIC PANEL
BUN: 20 mg/dL (ref 4–21)
Creatinine: 0.9 mg/dL (ref <=1.1)
Glucose: 89 mg/dL
Sodium: 137 mmol/L (ref 137–147)

## 2013-09-28 LAB — CBC AND DIFFERENTIAL
NEUTROS ABS: 57 /uL
WBC: 5.9 10*3/mL

## 2013-09-28 LAB — TSH: TSH: 2.1 u[IU]/mL (ref <=5.90)

## 2013-12-24 ENCOUNTER — Encounter: Payer: Self-pay | Admitting: Family Medicine

## 2014-05-03 ENCOUNTER — Ambulatory Visit (INDEPENDENT_AMBULATORY_CARE_PROVIDER_SITE_OTHER): Payer: BLUE CROSS/BLUE SHIELD | Admitting: Family Medicine

## 2014-05-03 ENCOUNTER — Encounter: Payer: Self-pay | Admitting: Family Medicine

## 2014-05-03 VITALS — BP 119/88 | HR 97 | Ht 61.5 in | Wt 138.0 lb

## 2014-05-03 DIAGNOSIS — Z124 Encounter for screening for malignant neoplasm of cervix: Secondary | ICD-10-CM | POA: Diagnosis not present

## 2014-05-03 DIAGNOSIS — K219 Gastro-esophageal reflux disease without esophagitis: Secondary | ICD-10-CM

## 2014-05-03 DIAGNOSIS — F909 Attention-deficit hyperactivity disorder, unspecified type: Secondary | ICD-10-CM

## 2014-05-03 DIAGNOSIS — Z1151 Encounter for screening for human papillomavirus (HPV): Secondary | ICD-10-CM

## 2014-05-03 DIAGNOSIS — R198 Other specified symptoms and signs involving the digestive system and abdomen: Secondary | ICD-10-CM

## 2014-05-03 DIAGNOSIS — Z01419 Encounter for gynecological examination (general) (routine) without abnormal findings: Secondary | ICD-10-CM

## 2014-05-03 DIAGNOSIS — Q525 Fusion of labia: Secondary | ICD-10-CM | POA: Diagnosis not present

## 2014-05-03 DIAGNOSIS — F988 Other specified behavioral and emotional disorders with onset usually occurring in childhood and adolescence: Secondary | ICD-10-CM | POA: Insufficient documentation

## 2014-05-03 HISTORY — DX: Gastro-esophageal reflux disease without esophagitis: K21.9

## 2014-05-03 LAB — CBC
HEMATOCRIT: 44.1 % (ref 36.0–46.0)
HEMOGLOBIN: 14.8 g/dL (ref 12.0–15.0)
MCH: 31.5 pg (ref 26.0–34.0)
MCHC: 33.6 g/dL (ref 30.0–36.0)
MCV: 93.8 fL (ref 78.0–100.0)
MPV: 11.7 fL (ref 8.6–12.4)
Platelets: 250 10*3/uL (ref 150–400)
RBC: 4.7 MIL/uL (ref 3.87–5.11)
RDW: 12.9 % (ref 11.5–15.5)
WBC: 5.8 10*3/uL (ref 4.0–10.5)

## 2014-05-03 LAB — COMPREHENSIVE METABOLIC PANEL
ALT: 11 U/L (ref 0–35)
AST: 14 U/L (ref 0–37)
Albumin: 4.3 g/dL (ref 3.5–5.2)
Alkaline Phosphatase: 53 U/L (ref 39–117)
BILIRUBIN TOTAL: 0.4 mg/dL (ref 0.2–1.2)
BUN: 13 mg/dL (ref 6–23)
CHLORIDE: 104 meq/L (ref 96–112)
CO2: 29 meq/L (ref 19–32)
Calcium: 9.5 mg/dL (ref 8.4–10.5)
Creat: 0.82 mg/dL (ref 0.50–1.10)
Glucose, Bld: 57 mg/dL — ABNORMAL LOW (ref 70–99)
Potassium: 4.3 mEq/L (ref 3.5–5.3)
SODIUM: 141 meq/L (ref 135–145)
TOTAL PROTEIN: 6.5 g/dL (ref 6.0–8.3)

## 2014-05-03 LAB — LIPID PANEL
CHOL/HDL RATIO: 3.8 ratio
CHOLESTEROL: 204 mg/dL — AB (ref 0–200)
HDL: 53 mg/dL (ref >=46)
LDL Cholesterol: 126 mg/dL — ABNORMAL HIGH (ref 0–99)
Triglycerides: 123 mg/dL (ref <150)
VLDL: 25 mg/dL (ref 0–40)

## 2014-05-03 LAB — TSH: TSH: 1.589 u[IU]/mL (ref 0.350–4.500)

## 2014-05-03 NOTE — Progress Notes (Signed)
Patient is having increased rectal pressure and is concerned about possible prolapse.  She did have an appointment with a GI doc but had to cancel.  This was for a consult for routine colonoscopy.  She plans to call them to reschedule it.  She has not had a pap in a couple of years and would like to have blood work today as well.  She had a small bowl of special K with fat free milk.  She has also had some chronic urinary tract issues, that she has been seen mostly at urgent care for as they tend to happen on the weekend following intercourse.  Her primary care feels that they might be related to menopause and prescribed estrogen cream but this irritates her and so she is not using it.

## 2014-05-03 NOTE — Assessment & Plan Note (Signed)
Trial of estrogen cream to area--may help with urinary symptoms as well.

## 2014-05-03 NOTE — Patient Instructions (Signed)
Preventive Care for Adults A healthy lifestyle and preventive care can promote health and wellness. Preventive health guidelines for women include the following key practices.  A routine yearly physical is a good way to check with your health care provider about your health and preventive screening. It is a chance to share any concerns and updates on your health and to receive a thorough exam.  Visit your dentist for a routine exam and preventive care every 6 months. Brush your teeth twice a day and floss once a day. Good oral hygiene prevents tooth decay and gum disease.  The frequency of eye exams is based on your age, health, family medical history, use of contact lenses, and other factors. Follow your health care provider's recommendations for frequency of eye exams.  Eat a healthy diet. Foods like vegetables, fruits, whole grains, low-fat dairy products, and lean protein foods contain the nutrients you need without too many calories. Decrease your intake of foods high in solid fats, added sugars, and salt. Eat the right amount of calories for you.Get information about a proper diet from your health care provider, if necessary.  Regular physical exercise is one of the most important things you can do for your health. Most adults should get at least 150 minutes of moderate-intensity exercise (any activity that increases your heart rate and causes you to sweat) each week. In addition, most adults need muscle-strengthening exercises on 2 or more days a week.  Maintain a healthy weight. The body mass index (BMI) is a screening tool to identify possible weight problems. It provides an estimate of body fat based on height and weight. Your health care provider can find your BMI and can help you achieve or maintain a healthy weight.For adults 20 years and older:  A BMI below 18.5 is considered underweight.  A BMI of 18.5 to 24.9 is normal.  A BMI of 25 to 29.9 is considered overweight.  A BMI of  30 and above is considered obese.  Maintain normal blood lipids and cholesterol levels by exercising and minimizing your intake of saturated fat. Eat a balanced diet with plenty of fruit and vegetables. Blood tests for lipids and cholesterol should begin at age 76 and be repeated every 5 years. If your lipid or cholesterol levels are high, you are over 50, or you are at high risk for heart disease, you may need your cholesterol levels checked more frequently.Ongoing high lipid and cholesterol levels should be treated with medicines if diet and exercise are not working.  If you smoke, find out from your health care provider how to quit. If you do not use tobacco, do not start.  Lung cancer screening is recommended for adults aged 22-80 years who are at high risk for developing lung cancer because of a history of smoking. A yearly low-dose CT scan of the lungs is recommended for people who have at least a 30-pack-year history of smoking and are a current smoker or have quit within the past 15 years. A pack year of smoking is smoking an average of 1 pack of cigarettes a day for 1 year (for example: 1 pack a day for 30 years or 2 packs a day for 15 years). Yearly screening should continue until the smoker has stopped smoking for at least 15 years. Yearly screening should be stopped for people who develop a health problem that would prevent them from having lung cancer treatment.  If you are pregnant, do not drink alcohol. If you are breastfeeding,  be very cautious about drinking alcohol. If you are not pregnant and choose to drink alcohol, do not have more than 1 drink per day. One drink is considered to be 12 ounces (355 mL) of beer, 5 ounces (148 mL) of wine, or 1.5 ounces (44 mL) of liquor.  Avoid use of street drugs. Do not share needles with anyone. Ask for help if you need support or instructions about stopping the use of drugs.  High blood pressure causes heart disease and increases the risk of  stroke. Your blood pressure should be checked at least every 1 to 2 years. Ongoing high blood pressure should be treated with medicines if weight loss and exercise do not work.  If you are 3-86 years old, ask your health care provider if you should take aspirin to prevent strokes.  Diabetes screening involves taking a blood sample to check your fasting blood sugar level. This should be done once every 3 years, after age 67, if you are within normal weight and without risk factors for diabetes. Testing should be considered at a younger age or be carried out more frequently if you are overweight and have at least 1 risk factor for diabetes.  Breast cancer screening is essential preventive care for women. You should practice "breast self-awareness." This means understanding the normal appearance and feel of your breasts and may include breast self-examination. Any changes detected, no matter how small, should be reported to a health care provider. Women in their 8s and 30s should have a clinical breast exam (CBE) by a health care provider as part of a regular health exam every 1 to 3 years. After age 70, women should have a CBE every year. Starting at age 25, women should consider having a mammogram (breast X-ray test) every year. Women who have a family history of breast cancer should talk to their health care provider about genetic screening. Women at a high risk of breast cancer should talk to their health care providers about having an MRI and a mammogram every year.  Breast cancer gene (BRCA)-related cancer risk assessment is recommended for women who have family members with BRCA-related cancers. BRCA-related cancers include breast, ovarian, tubal, and peritoneal cancers. Having family members with these cancers may be associated with an increased risk for harmful changes (mutations) in the breast cancer genes BRCA1 and BRCA2. Results of the assessment will determine the need for genetic counseling and  BRCA1 and BRCA2 testing.  Routine pelvic exams to screen for cancer are no longer recommended for nonpregnant women who are considered low risk for cancer of the pelvic organs (ovaries, uterus, and vagina) and who do not have symptoms. Ask your health care provider if a screening pelvic exam is right for you.  If you have had past treatment for cervical cancer or a condition that could lead to cancer, you need Pap tests and screening for cancer for at least 20 years after your treatment. If Pap tests have been discontinued, your risk factors (such as having a new sexual partner) need to be reassessed to determine if screening should be resumed. Some women have medical problems that increase the chance of getting cervical cancer. In these cases, your health care provider may recommend more frequent screening and Pap tests.  The HPV test is an additional test that may be used for cervical cancer screening. The HPV test looks for the virus that can cause the cell changes on the cervix. The cells collected during the Pap test can be  tested for HPV. The HPV test could be used to screen women aged 30 years and older, and should be used in women of any age who have unclear Pap test results. After the age of 30, women should have HPV testing at the same frequency as a Pap test.  Colorectal cancer can be detected and often prevented. Most routine colorectal cancer screening begins at the age of 50 years and continues through age 75 years. However, your health care provider may recommend screening at an earlier age if you have risk factors for colon cancer. On a yearly basis, your health care provider may provide home test kits to check for hidden blood in the stool. Use of a small camera at the end of a tube, to directly examine the colon (sigmoidoscopy or colonoscopy), can detect the earliest forms of colorectal cancer. Talk to your health care provider about this at age 50, when routine screening begins. Direct  exam of the colon should be repeated every 5-10 years through age 75 years, unless early forms of pre-cancerous polyps or small growths are found.  People who are at an increased risk for hepatitis B should be screened for this virus. You are considered at high risk for hepatitis B if:  You were born in a country where hepatitis B occurs often. Talk with your health care provider about which countries are considered high risk.  Your parents were born in a high-risk country and you have not received a shot to protect against hepatitis B (hepatitis B vaccine).  You have HIV or AIDS.  You use needles to inject street drugs.  You live with, or have sex with, someone who has hepatitis B.  You get hemodialysis treatment.  You take certain medicines for conditions like cancer, organ transplantation, and autoimmune conditions.  Hepatitis C blood testing is recommended for all people born from 1945 through 1965 and any individual with known risks for hepatitis C.  Practice safe sex. Use condoms and avoid high-risk sexual practices to reduce the spread of sexually transmitted infections (STIs). STIs include gonorrhea, chlamydia, syphilis, trichomonas, herpes, HPV, and human immunodeficiency virus (HIV). Herpes, HIV, and HPV are viral illnesses that have no cure. They can result in disability, cancer, and death.  You should be screened for sexually transmitted illnesses (STIs) including gonorrhea and chlamydia if:  You are sexually active and are younger than 24 years.  You are older than 24 years and your health care provider tells you that you are at risk for this type of infection.  Your sexual activity has changed since you were last screened and you are at an increased risk for chlamydia or gonorrhea. Ask your health care provider if you are at risk.  If you are at risk of being infected with HIV, it is recommended that you take a prescription medicine daily to prevent HIV infection. This is  called preexposure prophylaxis (PrEP). You are considered at risk if:  You are a heterosexual woman, are sexually active, and are at increased risk for HIV infection.  You take drugs by injection.  You are sexually active with a partner who has HIV.  Talk with your health care provider about whether you are at high risk of being infected with HIV. If you choose to begin PrEP, you should first be tested for HIV. You should then be tested every 3 months for as long as you are taking PrEP.  Osteoporosis is a disease in which the bones lose minerals and strength   with aging. This can result in serious bone fractures or breaks. The risk of osteoporosis can be identified using a bone density scan. Women ages 65 years and over and women at risk for fractures or osteoporosis should discuss screening with their health care providers. Ask your health care provider whether you should take a calcium supplement or vitamin D to reduce the rate of osteoporosis.  Menopause can be associated with physical symptoms and risks. Hormone replacement therapy is available to decrease symptoms and risks. You should talk to your health care provider about whether hormone replacement therapy is right for you.  Use sunscreen. Apply sunscreen liberally and repeatedly throughout the day. You should seek shade when your shadow is shorter than you. Protect yourself by wearing long sleeves, pants, a wide-brimmed hat, and sunglasses year round, whenever you are outdoors.  Once a month, do a whole body skin exam, using a mirror to look at the skin on your back. Tell your health care provider of new moles, moles that have irregular borders, moles that are larger than a pencil eraser, or moles that have changed in shape or color.  Stay current with required vaccines (immunizations).  Influenza vaccine. All adults should be immunized every year.  Tetanus, diphtheria, and acellular pertussis (Td, Tdap) vaccine. Pregnant women should  receive 1 dose of Tdap vaccine during each pregnancy. The dose should be obtained regardless of the length of time since the last dose. Immunization is preferred during the 27th-36th week of gestation. An adult who has not previously received Tdap or who does not know her vaccine status should receive 1 dose of Tdap. This initial dose should be followed by tetanus and diphtheria toxoids (Td) booster doses every 10 years. Adults with an unknown or incomplete history of completing a 3-dose immunization series with Td-containing vaccines should begin or complete a primary immunization series including a Tdap dose. Adults should receive a Td booster every 10 years.  Varicella vaccine. An adult without evidence of immunity to varicella should receive 2 doses or a second dose if she has previously received 1 dose. Pregnant females who do not have evidence of immunity should receive the first dose after pregnancy. This first dose should be obtained before leaving the health care facility. The second dose should be obtained 4-8 weeks after the first dose.  Human papillomavirus (HPV) vaccine. Females aged 13-26 years who have not received the vaccine previously should obtain the 3-dose series. The vaccine is not recommended for use in pregnant females. However, pregnancy testing is not needed before receiving a dose. If a female is found to be pregnant after receiving a dose, no treatment is needed. In that case, the remaining doses should be delayed until after the pregnancy. Immunization is recommended for any person with an immunocompromised condition through the age of 26 years if she did not get any or all doses earlier. During the 3-dose series, the second dose should be obtained 4-8 weeks after the first dose. The third dose should be obtained 24 weeks after the first dose and 16 weeks after the second dose.  Zoster vaccine. One dose is recommended for adults aged 60 years or older unless certain conditions are  present.  Measles, mumps, and rubella (MMR) vaccine. Adults born before 1957 generally are considered immune to measles and mumps. Adults born in 1957 or later should have 1 or more doses of MMR vaccine unless there is a contraindication to the vaccine or there is laboratory evidence of immunity to   each of the three diseases. A routine second dose of MMR vaccine should be obtained at least 28 days after the first dose for students attending postsecondary schools, health care workers, or international travelers. People who received inactivated measles vaccine or an unknown type of measles vaccine during 1963-1967 should receive 2 doses of MMR vaccine. People who received inactivated mumps vaccine or an unknown type of mumps vaccine before 1979 and are at high risk for mumps infection should consider immunization with 2 doses of MMR vaccine. For females of childbearing age, rubella immunity should be determined. If there is no evidence of immunity, females who are not pregnant should be vaccinated. If there is no evidence of immunity, females who are pregnant should delay immunization until after pregnancy. Unvaccinated health care workers born before 1957 who lack laboratory evidence of measles, mumps, or rubella immunity or laboratory confirmation of disease should consider measles and mumps immunization with 2 doses of MMR vaccine or rubella immunization with 1 dose of MMR vaccine.  Pneumococcal 13-valent conjugate (PCV13) vaccine. When indicated, a person who is uncertain of her immunization history and has no record of immunization should receive the PCV13 vaccine. An adult aged 19 years or older who has certain medical conditions and has not been previously immunized should receive 1 dose of PCV13 vaccine. This PCV13 should be followed with a dose of pneumococcal polysaccharide (PPSV23) vaccine. The PPSV23 vaccine dose should be obtained at least 8 weeks after the dose of PCV13 vaccine. An adult aged 19  years or older who has certain medical conditions and previously received 1 or more doses of PPSV23 vaccine should receive 1 dose of PCV13. The PCV13 vaccine dose should be obtained 1 or more years after the last PPSV23 vaccine dose.  Pneumococcal polysaccharide (PPSV23) vaccine. When PCV13 is also indicated, PCV13 should be obtained first. All adults aged 65 years and older should be immunized. An adult younger than age 65 years who has certain medical conditions should be immunized. Any person who resides in a nursing home or long-term care facility should be immunized. An adult smoker should be immunized. People with an immunocompromised condition and certain other conditions should receive both PCV13 and PPSV23 vaccines. People with human immunodeficiency virus (HIV) infection should be immunized as soon as possible after diagnosis. Immunization during chemotherapy or radiation therapy should be avoided. Routine use of PPSV23 vaccine is not recommended for American Indians, [place] Natives, or people younger than 65 years unless there are medical conditions that require PPSV23 vaccine. When indicated, people who have unknown immunization and have no record of immunization should receive PPSV23 vaccine. One-time revaccination 5 years after the first dose of PPSV23 is recommended for people aged 19-64 years who have chronic kidney failure, nephrotic syndrome, asplenia, or immunocompromised conditions. People who received 1-2 doses of PPSV23 before age 65 years should receive another dose of PPSV23 vaccine at age 65 years or later if at least 5 years have passed since the previous dose. Doses of PPSV23 are not needed for people immunized with PPSV23 at or after age 65 years.  Meningococcal vaccine. Adults with asplenia or persistent complement component deficiencies should receive 2 doses of quadrivalent meningococcal conjugate (MenACWY-D) vaccine. The doses should be obtained at least 2 months apart.  Microbiologists working with certain meningococcal bacteria, military recruits, people at risk during an outbreak, and people who travel to or live in countries with a high rate of meningitis should be immunized. A first-year college student up through age   21 years who is living in a residence hall should receive a dose if she did not receive a dose on or after her 16th birthday. Adults who have certain high-risk conditions should receive one or more doses of vaccine.  Hepatitis A vaccine. Adults who wish to be protected from this disease, have certain high-risk conditions, work with hepatitis A-infected animals, work in hepatitis A research labs, or travel to or work in countries with a high rate of hepatitis A should be immunized. Adults who were previously unvaccinated and who anticipate close contact with an international adoptee during the first 60 days after arrival in the [place] from a country with a high rate of hepatitis A should be immunized.  Hepatitis B vaccine. Adults who wish to be protected from this disease, have certain high-risk conditions, may be exposed to blood or other infectious body fluids, are household contacts or sex partners of hepatitis B positive people, are clients or workers in certain care facilities, or travel to or work in countries with a high rate of hepatitis B should be immunized.  Haemophilus influenzae type b (Hib) vaccine. A previously unvaccinated person with asplenia or sickle cell disease or having a scheduled splenectomy should receive 1 dose of Hib vaccine. Regardless of previous immunization, a recipient of a hematopoietic stem cell transplant should receive a 3-dose series 6-12 months after her successful transplant. Hib vaccine is not recommended for adults with HIV infection. Preventive Services / Frequency Ages 64 to 68 years  Blood pressure check.** / Every 1 to 2 years.  Lipid and cholesterol check.** / Every 5 years beginning at age  22.  Clinical breast exam.** / Every 3 years for women in their 88s and 53s.  BRCA-related cancer risk assessment.** / For women who have family members with a BRCA-related cancer (breast, ovarian, tubal, or peritoneal cancers).  Pap test.** / Every 2 years from ages 90 through 51. Every 3 years starting at age 21 through age 56 or 3 with a history of 3 consecutive normal Pap tests.  HPV screening.** / Every 3 years from ages 24 through ages 1 to 46 with a history of 3 consecutive normal Pap tests.  Hepatitis C blood test.** / For any individual with known risks for hepatitis C.  Skin self-exam. / Monthly.  Influenza vaccine. / Every year.  Tetanus, diphtheria, and acellular pertussis (Tdap, Td) vaccine.** / Consult your health care provider. Pregnant women should receive 1 dose of Tdap vaccine during each pregnancy. 1 dose of Td every 10 years.  Varicella vaccine.** / Consult your health care provider. Pregnant females who do not have evidence of immunity should receive the first dose after pregnancy.  HPV vaccine. / 3 doses over 6 months, if 72 and younger. The vaccine is not recommended for use in pregnant females. However, pregnancy testing is not needed before receiving a dose.  Measles, mumps, rubella (MMR) vaccine.** / You need at least 1 dose of MMR if you were born in 1957 or later. You may also need a 2nd dose. For females of childbearing age, rubella immunity should be determined. If there is no evidence of immunity, females who are not pregnant should be vaccinated. If there is no evidence of immunity, females who are pregnant should delay immunization until after pregnancy.  Pneumococcal 13-valent conjugate (PCV13) vaccine.** / Consult your health care provider.  Pneumococcal polysaccharide (PPSV23) vaccine.** / 1 to 2 doses if you smoke cigarettes or if you have certain conditions.  Meningococcal vaccine.** /  1 dose if you are age 19 to 21 years and a first-year college  student living in a residence hall, or have one of several medical conditions, you need to get vaccinated against meningococcal disease. You may also need additional booster doses.  Hepatitis A vaccine.** / Consult your health care provider.  Hepatitis B vaccine.** / Consult your health care provider.  Haemophilus influenzae type b (Hib) vaccine.** / Consult your health care provider. Ages 40 to 64 years  Blood pressure check.** / Every 1 to 2 years.  Lipid and cholesterol check.** / Every 5 years beginning at age 20 years.  Lung cancer screening. / Every year if you are aged 55-80 years and have a 30-pack-year history of smoking and currently smoke or have quit within the past 15 years. Yearly screening is stopped once you have quit smoking for at least 15 years or develop a health problem that would prevent you from having lung cancer treatment.  Clinical breast exam.** / Every year after age 40 years.  BRCA-related cancer risk assessment.** / For women who have family members with a BRCA-related cancer (breast, ovarian, tubal, or peritoneal cancers).  Mammogram.** / Every year beginning at age 40 years and continuing for as long as you are in good health. Consult with your health care provider.  Pap test.** / Every 3 years starting at age 30 years through age 65 or 70 years with a history of 3 consecutive normal Pap tests.  HPV screening.** / Every 3 years from ages 30 years through ages 65 to 70 years with a history of 3 consecutive normal Pap tests.  Fecal occult blood test (FOBT) of stool. / Every year beginning at age 50 years and continuing until age 75 years. You may not need to do this test if you get a colonoscopy every 10 years.  Flexible sigmoidoscopy or colonoscopy.** / Every 5 years for a flexible sigmoidoscopy or every 10 years for a colonoscopy beginning at age 50 years and continuing until age 75 years.  Hepatitis C blood test.** / For all people born from 1945 through  1965 and any individual with known risks for hepatitis C.  Skin self-exam. / Monthly.  Influenza vaccine. / Every year.  Tetanus, diphtheria, and acellular pertussis (Tdap/Td) vaccine.** / Consult your health care provider. Pregnant women should receive 1 dose of Tdap vaccine during each pregnancy. 1 dose of Td every 10 years.  Varicella vaccine.** / Consult your health care provider. Pregnant females who do not have evidence of immunity should receive the first dose after pregnancy.  Zoster vaccine.** / 1 dose for adults aged 60 years or older.  Measles, mumps, rubella (MMR) vaccine.** / You need at least 1 dose of MMR if you were born in 1957 or later. You may also need a 2nd dose. For females of childbearing age, rubella immunity should be determined. If there is no evidence of immunity, females who are not pregnant should be vaccinated. If there is no evidence of immunity, females who are pregnant should delay immunization until after pregnancy.  Pneumococcal 13-valent conjugate (PCV13) vaccine.** / Consult your health care provider.  Pneumococcal polysaccharide (PPSV23) vaccine.** / 1 to 2 doses if you smoke cigarettes or if you have certain conditions.  Meningococcal vaccine.** / Consult your health care provider.  Hepatitis A vaccine.** / Consult your health care provider.  Hepatitis B vaccine.** / Consult your health care provider.  Haemophilus influenzae type b (Hib) vaccine.** / Consult your health care provider. Ages 65   years and over  Blood pressure check.** / Every 1 to 2 years.  Lipid and cholesterol check.** / Every 5 years beginning at age 22 years.  Lung cancer screening. / Every year if you are aged 73-80 years and have a 30-pack-year history of smoking and currently smoke or have quit within the past 15 years. Yearly screening is stopped once you have quit smoking for at least 15 years or develop a health problem that would prevent you from having lung cancer  treatment.  Clinical breast exam.** / Every year after age 4 years.  BRCA-related cancer risk assessment.** / For women who have family members with a BRCA-related cancer (breast, ovarian, tubal, or peritoneal cancers).  Mammogram.** / Every year beginning at age 40 years and continuing for as long as you are in good health. Consult with your health care provider.  Pap test.** / Every 3 years starting at age 9 years through age 34 or 91 years with 3 consecutive normal Pap tests. Testing can be stopped between 65 and 70 years with 3 consecutive normal Pap tests and no abnormal Pap or HPV tests in the past 10 years.  HPV screening.** / Every 3 years from ages 57 years through ages 64 or 45 years with a history of 3 consecutive normal Pap tests. Testing can be stopped between 65 and 70 years with 3 consecutive normal Pap tests and no abnormal Pap or HPV tests in the past 10 years.  Fecal occult blood test (FOBT) of stool. / Every year beginning at age 15 years and continuing until age 17 years. You may not need to do this test if you get a colonoscopy every 10 years.  Flexible sigmoidoscopy or colonoscopy.** / Every 5 years for a flexible sigmoidoscopy or every 10 years for a colonoscopy beginning at age 86 years and continuing until age 71 years.  Hepatitis C blood test.** / For all people born from [birth year] through 1966 and any individual with known risks for hepatitis C.  Osteoporosis screening.** / A one-time screening for women ages 83 years and over and women at risk for fractures or osteoporosis.  Skin self-exam. / Monthly.  Influenza vaccine. / Every year.  Tetanus, diphtheria, and acellular pertussis (Tdap/Td) vaccine.** / 1 dose of Td every 10 years.  Varicella vaccine.** / Consult your health care provider.  Zoster vaccine.** / 1 dose for adults aged 61 years or older.  Pneumococcal 13-valent conjugate (PCV13) vaccine.** / Consult your health care provider.  Pneumococcal  polysaccharide (PPSV23) vaccine.** / 1 dose for all adults aged 28 years and older.  Meningococcal vaccine.** / Consult your health care provider.  Hepatitis A vaccine.** / Consult your health care provider.  Hepatitis B vaccine.** / Consult your health care provider.  Haemophilus influenzae type b (Hib) vaccine.** / Consult your health care provider. ** Family history and personal history of risk and conditions may change your health care provider's recommendations. Document Released: 04/06/2001 Document Revised: 06/25/2013 Document Reviewed: 07/06/2010 [facility] Patient Information 2015 [place], [place]. This information is not intended to replace advice given to you by your health care provider. Make sure you discuss any questions you have with your health care provider.

## 2014-05-03 NOTE — Progress Notes (Signed)
Subjective:     Kathy Barnes is a 54 y.o. female and is here for a comprehensive physical exam. The patient reports problems - rectal pressure x 6 months. Worse with exercise and spin bike. Pressure with defecation as well.Denies blood in stools. Also, reports some burning with urination following intercourse, but negative urine cultures.  Has an estrogen cream but not using it presently.   History   Social History  . Marital Status: Married    Spouse Name: N/A  . Number of Children: N/A  . Years of Education: N/A   Occupational History  . Not on file.   Social History Main Topics  . Smoking status: Never Smoker   . Smokeless tobacco: Not on file  . Alcohol Use: No  . Drug Use: No  . Sexual Activity:    Partners: Male    Birth Control/ Protection: Post-menopausal   Other Topics Concern  . Not on file   Social History Narrative   Health Maintenance  Topic Date Due  . HIV Screening  02/06/1975  . TETANUS/TDAP  02/06/1979  . MAMMOGRAM  02/05/2010  . COLONOSCOPY  02/05/2010  . INFLUENZA VACCINE  05/23/2014 (Originally 09/22/2013)  . PAP SMEAR  12/31/2014    The following portions of the patient's history were reviewed and updated as appropriate: allergies, current medications, past family history, past medical history, past social history, past surgical history and problem list.  Review of Systems Pertinent items are noted in HPI.   Objective:    BP 119/88 mmHg  Pulse 97  Ht 5' 1.5" (1.562 m)  Wt 138 lb (62.596 kg)  BMI 25.66 kg/m2  LMP 07/23/2009 General appearance: alert, cooperative and appears stated age Head: Normocephalic, without obvious abnormality, atraumatic Neck: no adenopathy, supple, symmetrical, trachea midline and thyroid not enlarged, symmetric, no tenderness/mass/nodules Lungs: clear to auscultation bilaterally Breasts: normal appearance, no masses or tenderness Heart: regular rate and rhythm, S1, S2 normal, no murmur, click, rub or  gallop Abdomen: soft, non-tender; bowel sounds normal; no masses,  no organomegaly Pelvic: cervix normal in appearance, no adnexal masses or tenderness, no cervical motion tenderness, rectovaginal septum normal, uterus normal size, shape, and consistency, vagina normal without discharge and hard stool palpated in rectum, labial agglutination noted anteriorly Extremities: extremities normal, atraumatic, no cyanosis or edema Pulses: 2+ and symmetric Skin: Skin color, texture, turgor normal. No rashes or lesions Lymph nodes: Cervical, supraclavicular, and axillary nodes normal. Neurologic: Grossly normal    Assessment:    Healthy female exam.      Plan:      Problem List Items Addressed This Visit      Unprioritized   GERD (gastroesophageal reflux disease)   Relevant Medications   omeprazole (PRILOSEC) 40 MG capsule   ADD (attention deficit disorder)   Labia minora agglutination    Trial of estrogen cream to area--may help with urinary symptoms as well.       Other Visit Diagnoses    Encounter for routine gynecological examination    -  Primary    Relevant Orders    CBC    Comprehensive metabolic panel    TSH    Lipid panel    MM DIGITAL SCREENING BILATERAL    Cytology - PAP    Screening for malignant neoplasm of cervix        Relevant Orders    Cytology - PAP    Rectal pressure        Unclear etiology-be sure to f/u with GI  for colonoscopy screening       See After Visit Summary for Counseling Recommendations

## 2014-05-06 LAB — CYTOLOGY - PAP

## 2014-07-09 DIAGNOSIS — G4700 Insomnia, unspecified: Secondary | ICD-10-CM | POA: Insufficient documentation

## 2014-07-09 DIAGNOSIS — F419 Anxiety disorder, unspecified: Secondary | ICD-10-CM | POA: Insufficient documentation

## 2014-07-09 DIAGNOSIS — J302 Other seasonal allergic rhinitis: Secondary | ICD-10-CM | POA: Insufficient documentation

## 2014-07-09 DIAGNOSIS — E785 Hyperlipidemia, unspecified: Secondary | ICD-10-CM | POA: Insufficient documentation

## 2014-09-05 ENCOUNTER — Ambulatory Visit (INDEPENDENT_AMBULATORY_CARE_PROVIDER_SITE_OTHER): Payer: BLUE CROSS/BLUE SHIELD | Admitting: Family Medicine

## 2014-09-05 ENCOUNTER — Encounter: Payer: Self-pay | Admitting: Family Medicine

## 2014-09-05 VITALS — BP 122/88 | HR 74 | Temp 98.6°F | Resp 16 | Ht 63.0 in | Wt 140.0 lb

## 2014-09-05 DIAGNOSIS — Z1211 Encounter for screening for malignant neoplasm of colon: Secondary | ICD-10-CM

## 2014-09-05 DIAGNOSIS — F909 Attention-deficit hyperactivity disorder, unspecified type: Secondary | ICD-10-CM

## 2014-09-05 DIAGNOSIS — F988 Other specified behavioral and emotional disorders with onset usually occurring in childhood and adolescence: Secondary | ICD-10-CM

## 2014-09-05 DIAGNOSIS — Z0000 Encounter for general adult medical examination without abnormal findings: Secondary | ICD-10-CM

## 2014-09-05 MED ORDER — AMPHETAMINE-DEXTROAMPHETAMINE 10 MG PO TABS: 10.0000 mg | ORAL_TABLET | Freq: Every day | ORAL | Status: DC

## 2014-09-05 NOTE — Progress Notes (Signed)
Patient ID: Kathy Barnes, female   DOB: [birth year], 54 y.o.   MRN: [id number]        Patient: Kathy Barnes, Female    DOB: [birth year], 54 y.o.   MRN: [id number] Visit Date: 09/05/2014  Today's Provider: Wilhemena Durie, MD   Chief Complaint  Patient presents with  . Annual Exam   Subjective:    Annual physical exam Kathy Barnes is a 54 y.o. female who presents today for health maintenance and complete physical. She feels well. She reports exercising at least 4 times a week. She reports she is sleeping fairly well. She reports that she wakes up early most morning because of hot flashes. Pt sees GYN for pap, mammogram and blood work. She had pap and blood work on 05/03/14 and still has to get her mammogram for this year.    Ratio feels great, she is exercising regularly, 4-5 times per week. She is taking less Adderall but is doing well with job. She is intentionally saying no work and working a little bit less. Still has a full low but she is no longer overwhelmed. -----------------------------------------------------------------   Review of Systems  Constitutional: Negative.   HENT: Negative.   Eyes: Negative.   Respiratory: Negative.   Cardiovascular: Negative.   Gastrointestinal: Negative.   Endocrine: Negative.   Genitourinary: Negative.   Musculoskeletal: Negative.   Skin: Negative.   Allergic/Immunologic: Negative.   Neurological: Negative.   Hematological: Negative.   Psychiatric/Behavioral: Nervous/anxious: slightly at night nothing significant.     Social History She  reports that she has never smoked. She does not have any smokeless tobacco history on file. She reports that she does not drink alcohol or use illicit drugs.  Patient Active Problem List   Diagnosis Date Noted  . Anxiety 07/09/2014  . HLD (hyperlipidemia) 07/09/2014  . Cannot sleep 07/09/2014  . Allergic rhinitis, seasonal 07/09/2014  . GERD (gastroesophageal reflux disease) 05/03/2014   . ADD (attention deficit disorder) 05/03/2014  . 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 Fibromyalgia   . Psoriasis     Past Surgical History  Procedure Laterality Date  . Cholecystectomy    . Hernia repair    . Tonsillectomy    . Ankle surgery    . Biopsy endometrial    . Tubal ligation    . Wisdom tooth extraction      x4  . Dilation and curettage of uterus    . Foot surgery Left     Family History Her family history includes ADD / ADHD in her son; Arthritis in her mother; Asthma in her father; Cancer in her maternal grandmother; Diabetes in her maternal grandmother and paternal grandmother; Heart disease in her paternal grandfather; Hyperlipidemia in her father; Hypertension in her brother, father, and mother; Stroke in her maternal grandfather.    Allergies  Allergen Reactions  . Ciprofloxacin     Previous Medications   ALPRAZOLAM (XANAX) 0.25 MG TABLET    Take by mouth.   AMPHETAMINE-DEXTROAMPHETAMINE (ADDERALL) 10 MG TABLET    Take 10 mg by mouth daily.   CONJUGATED ESTROGENS (PREMARIN) VAGINAL CREAM    Place vaginally.   MULTIPLE VITAMIN PO    Take by mouth.   NORTRIPTYLINE (PAMELOR) 25 MG CAPSULE    Take 25 mg by mouth at bedtime.   OMEPRAZOLE (PRILOSEC) 40 MG CAPSULE       RANITIDINE (ZANTAC) 300 MG TABLET    Take by mouth.    Patient Care Team: Janine Ores  Cranford Mon., MD as PCP - General (Family Medicine)     Objective:   Vitals: BP 122/88 mmHg  Pulse 74  Temp(Src) 98.6 F (37 C) (Oral)  Resp 16  Ht 5\' 3"  (1.6 m)  Wt 140 lb (63.504 kg)  BMI 24.81 kg/m2  LMP 07/23/2009   Physical Exam   Depression Screen No flowsheet data found.    Assessment & Plan:     Routine Health Maintenance and Physical Exam  Exercise Activities and Dietary recommendations Goals    None      Immunization History  Administered Date(s) Administered  . Influenza Split 12/23/2011  . Td 08/13/2010  . Tdap 10/22/2010    Health Maintenance  Topic Date Due  .  HIV Screening  02/06/1975  . MAMMOGRAM  02/05/2010  . COLONOSCOPY  02/05/2010  . INFLUENZA VACCINE  09/23/2014  . PAP SMEAR  05/02/2017  . TETANUS/TDAP  10/21/2020      Discussed health benefits of physical activity, and encouraged her to engage in regular exercise appropriate for her age and condition.   Postmenopausal status --patient has hot flashes plus or minus anxiety. She has been postmenopausal for 2 years. She sees GYN. She also sees Dr. Evorn Gong for skin issues. Her anxiety is under control she sleeps well. She needs about 7 hours of sleep per night. She was doing well with her fibromyalgia with the nortriptyline at 25 mg daily and eventually we'll be able to get her off of this. --------------------------------------------------------------------

## 2014-09-06 ENCOUNTER — Telehealth: Payer: Self-pay

## 2014-09-06 NOTE — Telephone Encounter (Signed)
LVM for pt to return my call.

## 2014-09-23 ENCOUNTER — Telehealth: Payer: Self-pay | Admitting: Gastroenterology

## 2014-09-23 NOTE — Telephone Encounter (Signed)
This is the patient you told you would call on Tuesday for a triage.

## 2014-10-09 ENCOUNTER — Encounter: Payer: Self-pay | Admitting: Gastroenterology

## 2014-10-09 NOTE — Telephone Encounter (Signed)
error 

## 2014-11-12 NOTE — Telephone Encounter (Signed)
Scheduled to see Dr. Allen Norris 11/13/14 in office

## 2014-11-13 ENCOUNTER — Other Ambulatory Visit: Payer: Self-pay

## 2014-11-13 ENCOUNTER — Ambulatory Visit (INDEPENDENT_AMBULATORY_CARE_PROVIDER_SITE_OTHER): Payer: BLUE CROSS/BLUE SHIELD | Admitting: Gastroenterology

## 2014-11-13 ENCOUNTER — Encounter: Payer: Self-pay | Admitting: Gastroenterology

## 2014-11-13 DIAGNOSIS — K219 Gastro-esophageal reflux disease without esophagitis: Secondary | ICD-10-CM

## 2014-11-13 NOTE — Progress Notes (Signed)
Gastroenterology Consultation  Referring Provider:     Jerrol Banana.,* Primary Care Physician:  Wilhemena Durie, MD Primary Gastroenterologist:  Dr. Allen Norris     Reason for Consultation:     Reflux        HPI:   Kathy Barnes is a 54 y.o. y/o female referred for consultation & management of reflux by Dr. Wilhemena Durie, MD.  This patient comes in today with a history of reflux. The patient states that it is made worse when she takes her Adderall. She does not notice it to be any worse when she eats tomatoes. The patient has had intermittent heartburn for the last 3 years. The patient takes omeprazole and Zantac when she needs it. There is no report of any dysphagia. She also denies any unexplained weight loss. There is no report of any black stools or bloody stools. The patient has never had a screening colonoscopy and also is in need of screening colonoscopy. There is no report of any abdominal pain associated with her reflux. He is never had an EGD or colonoscopy in the past.  Past Medical History  Diagnosis Date  . Abnormal uterine bleeding   . Fibromyalgia   . History of uterine fibroid 2011  . Anxiety     manage without medications.  Marland Kitchen GERD (gastroesophageal reflux disease)   . Psoriasis     Past Surgical History  Procedure Laterality Date  . Cholecystectomy    . Hernia repair    . Tonsillectomy    . Ankle surgery    . Biopsy endometrial    . Tubal ligation    . Wisdom tooth extraction      x4  . Dilation and curettage of uterus    . Foot surgery Left     Prior to Admission medications   Medication Sig Start Date End Date Taking? Authorizing Provider  amphetamine-dextroamphetamine (ADDERALL) 10 MG tablet Take 1 tablet (10 mg total) by mouth daily. 09/05/14  Yes Richard Maceo Pro., MD  MULTIPLE VITAMIN PO Take by mouth. 04/15/11  Yes Historical Provider, MD  nortriptyline (PAMELOR) 25 MG capsule Take 25 mg by mouth at bedtime.   Yes Historical Provider,  MD  omeprazole (PRILOSEC) 40 MG capsule  01/26/14  Yes Historical Provider, MD  ranitidine (ZANTAC) 300 MG tablet Take by mouth. 09/28/13  Yes Historical Provider, MD  ALPRAZolam Duanne Moron) 0.25 MG tablet Take by mouth. 11/27/13   Historical Provider, MD  conjugated estrogens (PREMARIN) vaginal cream Place vaginally. 11/27/13   Historical Provider, MD    Family History  Problem Relation Age of Onset  . Hypertension Mother   . Arthritis Mother   . Hypertension Father   . Asthma Father   . Hyperlipidemia Father   . Diabetes Maternal Grandmother   . Cancer Maternal Grandmother     kidney  . Stroke Maternal Grandfather   . Diabetes Paternal Grandmother   . Heart disease Paternal Grandfather   . ADD / ADHD Son   . Hypertension Brother      Social History  Substance Use Topics  . Smoking status: Never Smoker   . Smokeless tobacco: Never Used  . Alcohol Use: No    Allergies as of 11/13/2014 - Review Complete 11/13/2014  Allergen Reaction Noted  . Ciprofloxacin  07/09/2014    Review of Systems:    All systems reviewed and negative except where noted in HPI.   Physical Exam:  LMP 07/23/2009 Patient's last menstrual  period was 07/23/2009. Psych:  Alert and cooperative. Normal mood and affect. General:   Alert,  Well-developed, well-nourished, pleasant and cooperative in NAD Head:  Normocephalic and atraumatic. Eyes:  Sclera clear, no icterus.   Conjunctiva pink. Ears:  Normal auditory acuity. Nose:  No deformity, discharge, or lesions. Mouth:  No deformity or lesions,oropharynx pink & moist. Neck:  Supple; no masses or thyromegaly. Lungs:  Respirations even and unlabored.  Clear throughout to auscultation.   No wheezes, crackles, or rhonchi. No acute distress. Heart:  Regular rate and rhythm; no murmurs, clicks, rubs, or gallops. Abdomen:  Normal bowel sounds.  No bruits.  Soft, non-tender and non-distended without masses, hepatosplenomegaly or hernias noted.  No guarding or rebound  tenderness.  Negative Carnett sign.   Rectal:  Deferred.  Msk:  Symmetrical without gross deformities.  Good, equal movement & strength bilaterally. Pulses:  Normal pulses noted. Extremities:  No clubbing or edema.  No cyanosis. Neurologic:  Alert and oriented x3;  grossly normal neurologically. Skin:  Intact without significant lesions or rashes.  No jaundice. Lymph Nodes:  No significant cervical adenopathy. Psych:  Alert and cooperative. Normal mood and affect.  Imaging Studies: No results found.  Assessment and Plan:   Kathy Barnes is a 54 y.o. y/o female who comes in with a history of reflux and in need of a screening colonoscopy. The patient has no worrisome symptoms such as black stools or bloody stools. She also denies any dysphagia. The patient is well controlled with diet and avoiding triggering foods and medicines. The patient still takes Zantac and Prilosec as needed. The patient will be set up for an EGD and colonoscopy.I have discussed risks & benefits which include, but are not limited to, bleeding, infection, perforation & drug reaction.  The patient agrees with this plan & written consent will be obtained.   .   Note: This dictation was prepared with Dragon dictation along with smaller phrase technology. Any transcriptional errors that result from this process are unintentional.

## 2014-12-06 ENCOUNTER — Encounter: Payer: Self-pay | Admitting: *Deleted

## 2014-12-12 NOTE — Discharge Instructions (Signed)

## 2014-12-13 ENCOUNTER — Ambulatory Visit: Payer: BLUE CROSS/BLUE SHIELD | Admitting: Student in an Organized Health Care Education/Training Program

## 2014-12-13 ENCOUNTER — Other Ambulatory Visit: Payer: Self-pay | Admitting: Gastroenterology

## 2014-12-13 ENCOUNTER — Ambulatory Visit
Admission: RE | Admit: 2014-12-13 | Discharge: 2014-12-13 | Disposition: A | Discharge status: Home / Self Care | Payer: BLUE CROSS/BLUE SHIELD | Source: Ambulatory Visit | Attending: Gastroenterology | Admitting: Gastroenterology

## 2014-12-13 ENCOUNTER — Encounter
Admission: RE | Disposition: A | Discharge status: Home / Self Care | Payer: Self-pay | Source: Ambulatory Visit | Attending: Gastroenterology

## 2014-12-13 DIAGNOSIS — Z8249 Family history of ischemic heart disease and other diseases of the circulatory system: Secondary | ICD-10-CM | POA: Insufficient documentation

## 2014-12-13 DIAGNOSIS — Z1211 Encounter for screening for malignant neoplasm of colon: Secondary | ICD-10-CM | POA: Insufficient documentation

## 2014-12-13 DIAGNOSIS — R42 Dizziness and giddiness: Secondary | ICD-10-CM | POA: Diagnosis not present

## 2014-12-13 DIAGNOSIS — Z9049 Acquired absence of other specified parts of digestive tract: Secondary | ICD-10-CM | POA: Diagnosis not present

## 2014-12-13 DIAGNOSIS — K219 Gastro-esophageal reflux disease without esophagitis: Secondary | ICD-10-CM | POA: Insufficient documentation

## 2014-12-13 DIAGNOSIS — Z79899 Other long term (current) drug therapy: Secondary | ICD-10-CM | POA: Insufficient documentation

## 2014-12-13 DIAGNOSIS — Z833 Family history of diabetes mellitus: Secondary | ICD-10-CM | POA: Diagnosis not present

## 2014-12-13 DIAGNOSIS — Z8261 Family history of arthritis: Secondary | ICD-10-CM | POA: Diagnosis not present

## 2014-12-13 DIAGNOSIS — D125 Benign neoplasm of sigmoid colon: Secondary | ICD-10-CM | POA: Insufficient documentation

## 2014-12-13 DIAGNOSIS — R12 Heartburn: Secondary | ICD-10-CM | POA: Diagnosis not present

## 2014-12-13 DIAGNOSIS — Z818 Family history of other mental and behavioral disorders: Secondary | ICD-10-CM | POA: Diagnosis not present

## 2014-12-13 DIAGNOSIS — Z881 Allergy status to other antibiotic agents status: Secondary | ICD-10-CM | POA: Insufficient documentation

## 2014-12-13 DIAGNOSIS — K449 Diaphragmatic hernia without obstruction or gangrene: Secondary | ICD-10-CM | POA: Insufficient documentation

## 2014-12-13 DIAGNOSIS — F419 Anxiety disorder, unspecified: Secondary | ICD-10-CM | POA: Insufficient documentation

## 2014-12-13 DIAGNOSIS — Z8051 Family history of malignant neoplasm of kidney: Secondary | ICD-10-CM | POA: Diagnosis not present

## 2014-12-13 DIAGNOSIS — M797 Fibromyalgia: Secondary | ICD-10-CM | POA: Diagnosis not present

## 2014-12-13 DIAGNOSIS — K2970 Gastritis, unspecified, without bleeding: Secondary | ICD-10-CM | POA: Insufficient documentation

## 2014-12-13 DIAGNOSIS — L409 Psoriasis, unspecified: Secondary | ICD-10-CM | POA: Diagnosis not present

## 2014-12-13 DIAGNOSIS — Z825 Family history of asthma and other chronic lower respiratory diseases: Secondary | ICD-10-CM | POA: Diagnosis not present

## 2014-12-13 HISTORY — PX: POLYPECTOMY: SHX5525

## 2014-12-13 HISTORY — DX: Dizziness and giddiness: R42

## 2014-12-13 HISTORY — PX: ESOPHAGOGASTRODUODENOSCOPY (EGD) WITH PROPOFOL: SHX5813

## 2014-12-13 HISTORY — PX: COLONOSCOPY WITH PROPOFOL: SHX5780

## 2014-12-13 SURGERY — COLONOSCOPY WITH PROPOFOL
Anesthesia: Monitor Anesthesia Care | Wound class: Contaminated

## 2014-12-13 MED ORDER — OXYCODONE HCL 5 MG/5ML PO SOLN: 5.0000 mg | Freq: Once | ORAL | Status: DC | PRN

## 2014-12-13 MED ORDER — PROPOFOL 10 MG/ML IV BOLUS
INTRAVENOUS | Status: DC | PRN
  Administered 2014-12-13: 30 mg via INTRAVENOUS
  Administered 2014-12-13: 40 mg via INTRAVENOUS
  Administered 2014-12-13 (×2): 50 mg via INTRAVENOUS
  Administered 2014-12-13: 30 mg via INTRAVENOUS
  Administered 2014-12-13: 100 mg via INTRAVENOUS
  Administered 2014-12-13: 40 mg via INTRAVENOUS
  Administered 2014-12-13: 50 mg via INTRAVENOUS
  Administered 2014-12-13: 40 mg via INTRAVENOUS
  Administered 2014-12-13: 50 mg via INTRAVENOUS

## 2014-12-13 MED ORDER — ACETAMINOPHEN 160 MG/5ML PO SOLN: 325.0000 mg | ORAL | Status: DC | PRN

## 2014-12-13 MED ORDER — GLYCOPYRROLATE 0.2 MG/ML IJ SOLN
INTRAMUSCULAR | Status: DC | PRN
  Administered 2014-12-13: 0.1 mg via INTRAVENOUS

## 2014-12-13 MED ORDER — LIDOCAINE HCL (CARDIAC) 20 MG/ML IV SOLN
INTRAVENOUS | Status: DC | PRN
  Administered 2014-12-13: 40 mg via INTRAVENOUS

## 2014-12-13 MED ORDER — LACTATED RINGERS IV SOLN
INTRAVENOUS | Status: DC
  Administered 2014-12-13: 09:00:00 via INTRAVENOUS

## 2014-12-13 MED ORDER — OXYCODONE HCL 5 MG PO TABS: 5.0000 mg | ORAL_TABLET | Freq: Once | ORAL | Status: DC | PRN

## 2014-12-13 MED ORDER — DEXAMETHASONE SODIUM PHOSPHATE 4 MG/ML IJ SOLN: 8.0000 mg | Freq: Once | INTRAMUSCULAR | Status: DC | PRN

## 2014-12-13 MED ORDER — LACTATED RINGERS IV SOLN: 500.0000 mL | INTRAVENOUS | Status: DC

## 2014-12-13 MED ORDER — STERILE WATER FOR IRRIGATION IR SOLN
Status: DC | PRN
  Administered 2014-12-13: 10:00:00

## 2014-12-13 MED ORDER — FENTANYL CITRATE (PF) 100 MCG/2ML IJ SOLN: 25.0000 ug | INTRAMUSCULAR | Status: DC | PRN

## 2014-12-13 MED ORDER — ACETAMINOPHEN 325 MG PO TABS: 325.0000 mg | ORAL_TABLET | ORAL | Status: DC | PRN

## 2014-12-13 SURGICAL SUPPLY — 39 items
BALLN DILATOR 10-12 8 (BALLOONS)
BALLN DILATOR 12-15 8 (BALLOONS)
BALLN DILATOR 15-18 8 (BALLOONS)
BALLN DILATOR CRE 0-12 8 (BALLOONS)
BALLN DILATOR ESOPH 8 10 CRE (MISCELLANEOUS) IMPLANT
BALLOON DILATOR 12-15 8 (BALLOONS) IMPLANT
BALLOON DILATOR 15-18 8 (BALLOONS) IMPLANT
BALLOON DILATOR CRE 0-12 8 (BALLOONS) IMPLANT
BLOCK BITE 60FR ADLT L/F GRN (MISCELLANEOUS) ×3 IMPLANT
CANISTER SUCT 1200ML W/VALVE (MISCELLANEOUS) ×3 IMPLANT
FCP ESCP3.2XJMB 240X2.8X (MISCELLANEOUS)
FORCEPS BIOP RAD 4 LRG CAP 4 (CUTTING FORCEPS) IMPLANT
FORCEPS BIOP RJ4 240 W/NDL (MISCELLANEOUS)
FORCEPS ESCP3.2XJMB 240X2.8X (MISCELLANEOUS) IMPLANT
GOWN CVR UNV OPN BCK APRN NK (MISCELLANEOUS) ×4 IMPLANT
GOWN ISOL THUMB LOOP REG UNIV (MISCELLANEOUS) ×6
HEMOCLIP INSTINCT (CLIP) IMPLANT
INJECTOR VARIJECT VIN23 (MISCELLANEOUS) IMPLANT
KIT CO2 TUBING (TUBING) IMPLANT
KIT DEFENDO VALVE AND CONN (KITS) IMPLANT
KIT ENDO PROCEDURE OLY (KITS) ×3 IMPLANT
LIGATOR MULTIBAND 6SHOOTER MBL (MISCELLANEOUS) IMPLANT
MARKER SPOT ENDO TATTOO 5ML (MISCELLANEOUS) IMPLANT
PAD GROUND ADULT SPLIT (MISCELLANEOUS) IMPLANT
SNARE SHORT THROW 13M SML OVAL (MISCELLANEOUS) ×3 IMPLANT
SNARE SHORT THROW 30M LRG OVAL (MISCELLANEOUS) IMPLANT
SPOT EX ENDOSCOPIC TATTOO (MISCELLANEOUS)
SUCTION POLY TRAP 4CHAMBER (MISCELLANEOUS) IMPLANT
SYR INFLATION 60ML (SYRINGE) IMPLANT
TRAP SUCTION POLY (MISCELLANEOUS) ×3 IMPLANT
TUBING CONN 6MMX3.1M (TUBING)
TUBING SUCTION CONN 0.25 STRL (TUBING) IMPLANT
UNDERPAD 30X60 958B10 (PK) (MISCELLANEOUS) IMPLANT
VALVE BIOPSY ENDO (VALVE) IMPLANT
VARIJECT INJECTOR VIN23 (MISCELLANEOUS)
WATER AUXILLARY (MISCELLANEOUS) IMPLANT
WATER STERILE IRR 250ML POUR (IV SOLUTION) ×3 IMPLANT
WATER STERILE IRR 500ML POUR (IV SOLUTION) IMPLANT
WIRE CRE 18-20MM 8CM F G (MISCELLANEOUS) IMPLANT

## 2014-12-13 NOTE — Op Note (Signed)
[facility] Gastroenterology Patient Name: Kathy Barnes Procedure Date: 12/13/2014 9:23 AM MRN: [id number] Account #: [id number] Date of Birth: December 07, 1960 Admit Type: Outpatient Age: 54 Room: [facility] OR ROOM 01 Gender: Female Note Status: Finalized Procedure:         Upper GI endoscopy Indications:       Heartburn Providers:         Lucilla Lame, MD Referring MD:      Janine Ores. Rosanna Randy, MD (Referring MD) Medicines:         Propofol per Anesthesia Complications:     No immediate complications. Procedure:         Pre-Anesthesia Assessment:                    - Prior to the procedure, a History and Physical was                     performed, and patient medications and allergies were                     reviewed. The patient's tolerance of previous anesthesia                     was also reviewed. The risks and benefits of the procedure                     and the sedation options and risks were discussed with the                     patient. All questions were answered, and informed consent                     was obtained. Prior Anticoagulants: The patient has taken                     no previous anticoagulant or antiplatelet agents. ASA                     Grade Assessment: II - A patient with mild systemic                     disease. After reviewing the risks and benefits, the                     patient was deemed in satisfactory condition to undergo                     the procedure.                    After obtaining informed consent, the endoscope was passed                     under direct vision. Throughout the procedure, the                     patient's blood pressure, pulse, and oxygen saturations                     were monitored continuously. The was introduced through                     the mouth, and advanced to the second part of duodenum.  The upper GI endoscopy was accomplished without                     difficulty. The  patient tolerated the procedure well. Findings:      A small hiatus hernia was present.      Diffuse moderate inflammation characterized by erythema was found in the       entire examined stomach. Biopsies were taken with a cold forceps for       histology.      The examined duodenum was normal. Impression:        - Small hiatus hernia.                    - Gastritis. Biopsied.                    - Normal examined duodenum. Recommendation:    - Await pathology results.                    - Perform a colonoscopy today. Procedure Code(s): --- Professional ---                    [id number], Esophagogastroduodenoscopy, flexible, transoral;                     with biopsy, single or multiple Diagnosis Code(s): --- Professional ---                    R12, Heartburn                    K29.70, Gastritis, unspecified, without bleeding CPT copyright 2014 American [employer] Association. All rights reserved. The codes documented in this report are preliminary and upon coder review may  be revised to meet current compliance requirements. Lucilla Lame, MD 12/13/2014 9:36:16 AM This report has been signed electronically. Number of Addenda: 0 Note Initiated On: 12/13/2014 9:23 AM      [facility]

## 2014-12-13 NOTE — Op Note (Signed)
[facility] Gastroenterology Patient Name: Kathy Barnes Procedure Date: 12/13/2014 9:36 AM MRN: [id number] Account #: [id number] Date of Birth: 02/26/1960 Admit Type: Outpatient Age: 54 Room: [facility] OR ROOM 01 Gender: Female Note Status: Finalized Procedure:         Colonoscopy Indications:       Screening for colorectal malignant neoplasm Providers:         Lucilla Lame, MD Referring MD:      Janine Ores. Rosanna Randy, MD (Referring MD) Medicines:         Propofol per Anesthesia Complications:     No immediate complications. Procedure:         Pre-Anesthesia Assessment:                    - Prior to the procedure, a History and Physical was                     performed, and patient medications and allergies were                     reviewed. The patient's tolerance of previous anesthesia                     was also reviewed. The risks and benefits of the procedure                     and the sedation options and risks were discussed with the                     patient. All questions were answered, and informed consent                     was obtained. Prior Anticoagulants: The patient has taken                     no previous anticoagulant or antiplatelet agents. ASA                     Grade Assessment: II - A patient with mild systemic                     disease. After reviewing the risks and benefits, the                     patient was deemed in satisfactory condition to undergo                     the procedure.                    After obtaining informed consent, the colonoscope was                     passed under direct vision. Throughout the procedure, the                     patient's blood pressure, pulse, and oxygen saturations                     were monitored continuously. The Olympus CF H180AL                     colonoscope (S#: [id number]) was introduced through the anus  and advanced to the the cecum, identified by appendiceal                 orifice and ileocecal valve. The colonoscopy was performed                     without difficulty. The patient tolerated the procedure                     well. The quality of the bowel preparation was fair. Findings:      The perianal and digital rectal examinations were normal.      A 3 mm polyp was found in the sigmoid colon. The polyp was sessile. The       polyp was removed with a cold snare. Resection and retrieval were       complete. The polyp was removed with a cold snare. Resection and       retrieval were complete. Impression:        - One 3 mm polyp in the sigmoid colon. Resected and                     retrieved. Recommendation:    - Repeat colonoscopy in 5 years if polyp adenoma and 10                     years if hyperplastic Procedure Code(s): --- Professional ---                    [phone], Colonoscopy, flexible; with removal of tumor(s),                     polyp(s), or other lesion(s) by snare technique Diagnosis Code(s): --- Professional ---                    Z12.11, Encounter for screening for malignant neoplasm of                     colon                    D12.5, Benign neoplasm of sigmoid colon CPT copyright 2014 American Medical Association. All rights reserved. The codes documented in this report are preliminary and upon coder review may  be revised to meet current compliance requirements. Lucilla Lame, MD 12/13/2014 9:53:18 AM This report has been signed electronically. Number of Addenda: 0 Note Initiated On: 12/13/2014 9:36 AM Scope Withdrawal Time: 0 hours 8 minutes 3 seconds  Total Procedure Duration: 0 hours 11 minutes 50 seconds       [facility]

## 2014-12-13 NOTE — Transfer of Care (Signed)
Immediate Anesthesia Transfer of Care Note  Patient: Kathy Barnes  Procedure(s) Performed: Procedure(s): COLONOSCOPY WITH PROPOFOL (N/A) ESOPHAGOGASTRODUODENOSCOPY (EGD) WITH PROPOFOL (N/A) POLYPECTOMY  Patient Location: PACU  Anesthesia Type: MAC  Level of Consciousness: awake, alert  and patient cooperative  Airway and Oxygen Therapy: Patient Spontanous Breathing and Patient connected to supplemental oxygen  Post-op Assessment: Post-op Vital signs reviewed, Patient's Cardiovascular Status Stable, Respiratory Function Stable, Patent Airway and No signs of Nausea or vomiting  Post-op Vital Signs: Reviewed and stable  Complications: No apparent anesthesia complications

## 2014-12-13 NOTE — Anesthesia Preprocedure Evaluation (Signed)
Anesthesia Evaluation  Patient identified by MRN, date of birth, ID band Patient awake    Reviewed: Allergy & Precautions, H&P , NPO status , Patient's Chart, lab work & pertinent test results, reviewed documented beta blocker date and time   Airway Mallampati: II  TM Distance: >3 FB Neck ROM: full    Dental no notable dental hx.    Pulmonary neg pulmonary ROS,    Pulmonary exam normal breath sounds clear to auscultation       Cardiovascular Exercise Tolerance: Good negative cardio ROS   Rhythm:regular Rate:Normal     Neuro/Psych PSYCHIATRIC DISORDERS Anxiety negative neurological ROS  negative psych ROS   GI/Hepatic Neg liver ROS, GERD  Medicated,  Endo/Other  negative endocrine ROS  Renal/GU negative Renal ROS  negative genitourinary   Musculoskeletal  (+) Fibromyalgia -  Abdominal   Peds negative pediatric ROS (+)  Hematology negative hematology ROS (+)   Anesthesia Other Findings   Reproductive/Obstetrics negative OB ROS                             Anesthesia Physical Anesthesia Plan  ASA: II  Anesthesia Plan: MAC   Post-op Pain Management:    Induction:   Airway Management Planned:   Additional Equipment:   Intra-op Plan:   Post-operative Plan:   Informed Consent: I have reviewed the patients History and Physical, chart, labs and discussed the procedure including the risks, benefits and alternatives for the proposed anesthesia with the patient or authorized representative who has indicated his/her understanding and acceptance.     Plan Discussed with: CRNA  Anesthesia Plan Comments:         Anesthesia Quick Evaluation

## 2014-12-13 NOTE — H&P (Signed)
[facility]  [address], [place], [place] [address] Phone: [phone] Fax : [phone]  Primary Care Physician:  Wilhemena Durie, MD Primary Gastroenterologist:  Dr. Allen Norris  Pre-Procedure History & Physical: HPI:  Kathy Barnes is a 54 y.o. female is here for an endoscopy and colonoscopy.   Past Medical History  Diagnosis Date  . Abnormal uterine bleeding   . Fibromyalgia   . History of uterine fibroid 2011  . Anxiety     manage without medications.  Marland Kitchen GERD (gastroesophageal reflux disease)   . Psoriasis   . Vertigo     last episode approx 12 yrs ago    Past Surgical History  Procedure Laterality Date  . Cholecystectomy    . Hernia repair    . Tonsillectomy    . Ankle surgery    . Biopsy endometrial    . Tubal ligation    . Wisdom tooth extraction      x4  . Dilation and curettage of uterus    . Foot surgery Left     Prior to Admission medications   Medication Sig Start Date End Date Taking? Authorizing Provider  nortriptyline (PAMELOR) 25 MG capsule Take 25 mg by mouth at bedtime.   Yes Historical Provider, MD  ALPRAZolam Duanne Moron) 0.25 MG tablet Take by mouth. 11/27/13   Historical Provider, MD  amphetamine-dextroamphetamine (ADDERALL) 10 MG tablet Take 1 tablet (10 mg total) by mouth daily. Patient taking differently: Take 10 mg by mouth daily as needed.  09/05/14   Richard Maceo Pro., MD  conjugated estrogens (PREMARIN) vaginal cream Place vaginally. 11/27/13   Historical Provider, MD  MULTIPLE VITAMIN PO Take by mouth. 04/15/11   Historical Provider, MD  omeprazole (PRILOSEC) 40 MG capsule daily as needed.  01/26/14   Historical Provider, MD  ranitidine (ZANTAC) 300 MG tablet Take by mouth daily as needed.  09/28/13   Historical Provider, MD    Allergies as of 11/13/2014 - Review Complete 11/13/2014  Allergen Reaction Noted  . Ciprofloxacin  07/09/2014    Family History  Problem Relation Age of Onset  . Hypertension Mother   .  Arthritis Mother   . Hypertension Father   . Asthma Father   . Hyperlipidemia Father   . Diabetes Maternal Grandmother   . Cancer Maternal Grandmother     kidney  . Stroke Maternal Grandfather   . Diabetes Paternal Grandmother   . Heart disease Paternal Grandfather   . ADD / ADHD Son   . Hypertension Brother     Social History   Social History  . Marital Status: Married    Spouse Name: N/A  . Number of Children: N/A  . Years of Education: N/A   Occupational History  . Not on file.   Social History Main Topics  . Smoking status: Never Smoker   . Smokeless tobacco: Never Used  . Alcohol Use: No  . Drug Use: No  . Sexual Activity:    Partners: Male    Birth Control/ Protection: Post-menopausal   Other Topics Concern  . Not on file   Social History Narrative    Review of Systems: See HPI, otherwise negative ROS  Physical Exam: BP 113/97 mmHg  Pulse 99  Temp(Src) 98.2 F (36.8 C) (Temporal)  Resp 16  Ht 5\' 2"  (1.575 m)  Wt 135 lb (61.236 kg)  BMI 24.69 kg/m2  SpO2 96%  LMP 07/23/2009 General:   Alert,  pleasant and cooperative in NAD Head:  Normocephalic and atraumatic. Neck:  Supple; no masses or thyromegaly. Lungs:  Clear throughout to auscultation.    Heart:  Regular rate and rhythm. Abdomen:  Soft, nontender and nondistended. Normal bowel sounds, without guarding, and without rebound.   Neurologic:  Alert and  oriented x4;  grossly normal neurologically.  Impression/Plan: Kathy Barnes is here for an endoscopy and colonoscopy to be performed for GERD and screening.  Risks, benefits, limitations, and alternatives regarding  endoscopy and colonoscopy have been reviewed with the patient.  Questions have been answered.  All parties agreeable.   Ollen Bowl, MD  12/13/2014, 8:54 AM

## 2014-12-13 NOTE — Anesthesia Postprocedure Evaluation (Signed)
  Anesthesia Post-op Note  Patient: Kathy Barnes  Procedure(s) Performed: Procedure(s): COLONOSCOPY WITH PROPOFOL (N/A) ESOPHAGOGASTRODUODENOSCOPY (EGD) WITH PROPOFOL (N/A) POLYPECTOMY  Anesthesia type:MAC  Patient location: PACU  Post pain: Pain level controlled  Post assessment: Post-op Vital signs reviewed, Patient's Cardiovascular Status Stable, Respiratory Function Stable, Patent Airway and No signs of Nausea or vomiting  Post vital signs: Reviewed and stable  Last Vitals:  Filed Vitals:   12/13/14 1019  BP:   Pulse: 95  Temp:   Resp: 18    Level of consciousness: awake, alert  and patient cooperative  Complications: No apparent anesthesia complications

## 2014-12-13 NOTE — Anesthesia Procedure Notes (Signed)
Procedure Name: MAC Performed by: Inayah Woodin Pre-anesthesia Checklist: Patient identified, Emergency Drugs available, Suction available, Timeout performed and Patient being monitored Patient Re-evaluated:Patient Re-evaluated prior to inductionOxygen Delivery Method: Nasal cannula Placement Confirmation: positive ETCO2     

## 2014-12-16 ENCOUNTER — Encounter: Payer: Self-pay | Admitting: Gastroenterology

## 2014-12-17 ENCOUNTER — Telehealth: Payer: Self-pay | Admitting: Gastroenterology

## 2014-12-17 ENCOUNTER — Encounter: Payer: Self-pay | Admitting: Gastroenterology

## 2014-12-17 NOTE — Telephone Encounter (Signed)
Patient called and said she had her colonoscopy last week. What's next?

## 2014-12-17 NOTE — Telephone Encounter (Signed)
Pt notified of results from Colonoscopy and EGD. Pt will restart her PPI daily and call me back if symptoms worsen.

## 2015-01-13 ENCOUNTER — Encounter: Payer: Self-pay | Admitting: *Deleted

## 2015-01-14 ENCOUNTER — Telehealth: Payer: Self-pay | Admitting: Family Medicine

## 2015-01-15 ENCOUNTER — Other Ambulatory Visit: Payer: Self-pay

## 2015-01-15 DIAGNOSIS — F988 Other specified behavioral and emotional disorders with onset usually occurring in childhood and adolescence: Secondary | ICD-10-CM

## 2015-01-15 NOTE — Telephone Encounter (Signed)
Patient has sent a message through the computer requesting refill on Adderall.  Last visit was July.  Do you want this done? Thanks  ED

## 2015-01-15 NOTE — Telephone Encounter (Signed)
Refill request

## 2015-03-03 ENCOUNTER — Other Ambulatory Visit: Payer: Self-pay | Admitting: Family Medicine

## 2015-03-03 DIAGNOSIS — F988 Other specified behavioral and emotional disorders with onset usually occurring in childhood and adolescence: Secondary | ICD-10-CM

## 2015-03-04 ENCOUNTER — Telehealth: Payer: Self-pay

## 2015-03-04 DIAGNOSIS — F988 Other specified behavioral and emotional disorders with onset usually occurring in childhood and adolescence: Secondary | ICD-10-CM

## 2015-03-04 MED ORDER — AMPHETAMINE-DEXTROAMPHETAMINE 10 MG PO TABS: 10.0000 mg | ORAL_TABLET | Freq: Every day | ORAL | Status: DC

## 2015-03-04 NOTE — Telephone Encounter (Signed)
Appointment in feb/march

## 2015-03-04 NOTE — Telephone Encounter (Signed)
Request for adderall refill. Please review-aa

## 2015-03-05 MED ORDER — AMPHETAMINE-DEXTROAMPHETAMINE 10 MG PO TABS: 10.0000 mg | ORAL_TABLET | Freq: Every day | ORAL | Status: DC

## 2015-03-05 NOTE — Telephone Encounter (Signed)
RX printed. Pt advised-aa

## 2015-03-05 NOTE — Telephone Encounter (Signed)
Okay to refill once. We'll need to see her for the next refill.

## 2015-04-07 ENCOUNTER — Other Ambulatory Visit: Payer: Self-pay | Admitting: Family Medicine

## 2015-06-01 ENCOUNTER — Other Ambulatory Visit: Payer: Self-pay | Admitting: Family Medicine

## 2015-06-02 NOTE — Telephone Encounter (Signed)
Kathy Barnes's patient-aa

## 2016-06-08 ENCOUNTER — Other Ambulatory Visit: Payer: Self-pay | Admitting: Family Medicine

## 2016-06-08 NOTE — Telephone Encounter (Signed)
Please review-aa 

## 2017-07-19 DIAGNOSIS — Z13220 Encounter for screening for lipoid disorders: Secondary | ICD-10-CM | POA: Diagnosis not present

## 2017-07-19 DIAGNOSIS — Z01419 Encounter for gynecological examination (general) (routine) without abnormal findings: Secondary | ICD-10-CM | POA: Diagnosis not present

## 2017-07-19 DIAGNOSIS — Z0000 Encounter for general adult medical examination without abnormal findings: Secondary | ICD-10-CM | POA: Diagnosis not present

## 2017-07-19 DIAGNOSIS — Z6825 Body mass index (BMI) 25.0-25.9, adult: Secondary | ICD-10-CM | POA: Diagnosis not present

## 2017-07-19 DIAGNOSIS — Z1231 Encounter for screening mammogram for malignant neoplasm of breast: Secondary | ICD-10-CM | POA: Diagnosis not present

## 2017-12-26 ENCOUNTER — Encounter: Payer: Self-pay | Admitting: Family Medicine

## 2017-12-26 ENCOUNTER — Ambulatory Visit (INDEPENDENT_AMBULATORY_CARE_PROVIDER_SITE_OTHER): Payer: 59 | Admitting: Family Medicine

## 2017-12-26 VITALS — BP 122/80 | HR 80 | Temp 98.0°F | Resp 16 | Wt 148.0 lb

## 2017-12-26 DIAGNOSIS — K219 Gastro-esophageal reflux disease without esophagitis: Secondary | ICD-10-CM | POA: Diagnosis not present

## 2017-12-26 DIAGNOSIS — Z23 Encounter for immunization: Secondary | ICD-10-CM | POA: Diagnosis not present

## 2017-12-26 MED ORDER — OMEPRAZOLE 20 MG PO CPDR: 20.0000 mg | DELAYED_RELEASE_CAPSULE | Freq: Two times a day (BID) | ORAL | 3 refills | Status: DC

## 2017-12-26 NOTE — Progress Notes (Signed)
Patient: Kathy Barnes Female    DOB: 1960/11/20   57 y.o.   MRN: [id number] Visit Date: 12/26/2017  Today's Provider: Wilhemena Durie, MD   Chief Complaint  Patient presents with  . Gastroesophageal Reflux   Subjective:    Gastroesophageal Reflux  She complains of a hoarse voice and a sore throat. She reports no abdominal pain, no coughing or no nausea. This is a new problem. The current episode started in the past 7 days. The problem occurs constantly. The problem has been unchanged. The symptoms are aggravated by certain foods. Associated symptoms include fatigue. There are no known risk factors. She has tried nothing for the symptoms.  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     Allergies  Allergen Reactions  . Ciprofloxacin     Leg pain      Current Outpatient Medications:  .  ALPRAZolam (XANAX) 0.25 MG tablet, Take by mouth., Disp: , Rfl:  .  amphetamine-dextroamphetamine (ADDERALL) 10 MG tablet, Take 1 tablet (10 mg total) by mouth daily., Disp: 30 tablet, Rfl: 0 .  MULTIPLE VITAMIN PO, Take by mouth., Disp: , Rfl:  .  nortriptyline (PAMELOR) 25 MG capsule, TAKE 2 CAPSULES BY MOUTH 2 TIMES A DAY, Disp: 60 capsule, Rfl: 1 .  omeprazole (PRILOSEC) 40 MG capsule, daily as needed. , Disp: , Rfl:  .  PREMARIN vaginal cream, APPLY A SMALL AMOUNT TO URETHRA EVERY NIGHT BEFORE BED, Disp: 30 g, Rfl: 5 .  ranitidine (ZANTAC) 300 MG tablet, Take by mouth daily as needed. , Disp: , Rfl:   Review of Systems  Constitutional: Positive for fatigue. Negative for activity change and appetite change.  HENT: Positive for hoarse voice and sore throat. Negative for congestion.   Respiratory: Negative for cough and shortness of breath.   Cardiovascular: Negative.   Gastrointestinal: Negative for abdominal pain, constipation, diarrhea, nausea and vomiting.  Endocrine: Negative.   Allergic/Immunologic: Negative.   Neurological: Negative for dizziness and headaches.   Psychiatric/Behavioral: Negative.     Social History   Tobacco Use  . Smoking status: Never Smoker  . Smokeless tobacco: Never Used  Substance Use Topics  . Alcohol use: No   Objective:   BP 122/80 (BP Location: Left Arm, Patient Position: Sitting, Cuff Size: Normal)   Pulse 80   Temp 98 F (36.7 C)   Resp 16   Wt 148 lb (67.1 kg)   LMP 07/23/2009   SpO2 96%   BMI 27.07 kg/m  Vitals:   12/26/17 1634  BP: 122/80  Pulse: 80  Resp: 16  Temp: 98 F (36.7 C)  SpO2: 96%  Weight: 148 lb (67.1 kg)     Physical Exam  Constitutional: She is oriented to person, place, and time. She appears well-developed and well-nourished.  HENT:  Head: Normocephalic and atraumatic.  Mouth/Throat: Oropharynx is clear and moist.  Eyes: Conjunctivae are normal.  Neck: No thyromegaly present.  Cardiovascular: Normal rate, regular rhythm and normal heart sounds.  Pulmonary/Chest: Effort normal and breath sounds normal.  Abdominal: Soft.  Musculoskeletal: She exhibits no edema.  Neurological: She is alert and oriented to person, place, and time.  Skin: Skin is warm and dry.  Psychiatric: She has a normal mood and affect. Her behavior is normal. Judgment and thought content normal.        Assessment & Plan:     1. Gastroesophageal reflux disease without esophagitis RTC 1-2 months. - omeprazole (PRILOSEC) 20 MG  capsule; Take 1 capsule (20 mg total) by mouth 2 (two) times daily before a meal.  Dispense: 30 capsule; Refill: 3  2. Flu vaccine need  - Flu Vaccine QUAD 6+ mos PF IM (Fluarix Quad PF)  I have done the exam and reviewed the chart and it is accurate to the best of my knowledge. Development worker, community has been used and  any errors in dictation or transcription are unintentional. Miguel Aschoff M.D. [place], MD  [facility]

## 2018-04-20 ENCOUNTER — Ambulatory Visit (INDEPENDENT_AMBULATORY_CARE_PROVIDER_SITE_OTHER): Payer: 59 | Admitting: Family Medicine

## 2018-04-20 ENCOUNTER — Encounter: Payer: Self-pay | Admitting: Family Medicine

## 2018-04-20 VITALS — BP 112/80 | HR 88 | Temp 98.4°F | Resp 16 | Wt 141.0 lb

## 2018-04-20 DIAGNOSIS — F988 Other specified behavioral and emotional disorders with onset usually occurring in childhood and adolescence: Secondary | ICD-10-CM

## 2018-04-20 DIAGNOSIS — M797 Fibromyalgia: Secondary | ICD-10-CM

## 2018-04-20 DIAGNOSIS — F419 Anxiety disorder, unspecified: Secondary | ICD-10-CM

## 2018-04-20 NOTE — Progress Notes (Signed)
Patient: Kathy Barnes Female    DOB: 1960-07-27   58 y.o.   MRN: [id number] Visit Date: 04/20/2018  Today's Provider: Wilhemena Durie, MD   Chief Complaint  Patient presents with  . Follow-up   Subjective:     HPI  Patient would like to start back on Adderrall. She reports that she was on medication before in the past, and it helped. Patient reports that symptoms were controlled on 10mg  daily.    Allergies  Allergen Reactions  . Ciprofloxacin     Leg pain      Current Outpatient Medications:  .  ALPRAZolam (XANAX) 0.25 MG tablet, Take by mouth., Disp: , Rfl:  .  amphetamine-dextroamphetamine (ADDERALL) 10 MG tablet, Take 1 tablet (10 mg total) by mouth daily., Disp: 30 tablet, Rfl: 0 .  MULTIPLE VITAMIN PO, Take by mouth., Disp: , Rfl:  .  nortriptyline (PAMELOR) 25 MG capsule, TAKE 2 CAPSULES BY MOUTH 2 TIMES A DAY, Disp: 60 capsule, Rfl: 1 .  omeprazole (PRILOSEC) 20 MG capsule, Take 1 capsule (20 mg total) by mouth 2 (two) times daily before a meal., Disp: 30 capsule, Rfl: 3 .  omeprazole (PRILOSEC) 40 MG capsule, daily as needed. , Disp: , Rfl:  .  PREMARIN vaginal cream, APPLY A SMALL AMOUNT TO URETHRA EVERY NIGHT BEFORE BED, Disp: 30 g, Rfl: 5 .  ranitidine (ZANTAC) 300 MG tablet, Take by mouth daily as needed. , Disp: , Rfl:   Review of Systems  Constitutional: Negative for activity change, appetite change, chills and fatigue.  Eyes: Negative.   Respiratory: Negative.   Cardiovascular: Negative.   Allergic/Immunologic: Negative.   Neurological: Negative for dizziness and headaches.  Psychiatric/Behavioral: Positive for decreased concentration. Negative for agitation, self-injury and sleep disturbance. The patient is nervous/anxious.     Social History   Tobacco Use  . Smoking status: Never Smoker  . Smokeless tobacco: Never Used  Substance Use Topics  . Alcohol use: No      Objective:   BP 112/80 (BP Location: Left Arm, Patient Position:  Sitting, Cuff Size: Normal)   Pulse 88   Temp 98.4 F (36.9 C)   Resp 16   Wt 141 lb (64 kg)   LMP 07/23/2009   BMI 25.79 kg/m  Vitals:   04/20/18 1035  BP: 112/80  Pulse: 88  Resp: 16  Temp: 98.4 F (36.9 C)  Weight: 141 lb (64 kg)     Physical Exam Vitals signs reviewed.  Constitutional:      Appearance: She is well-developed.  HENT:     Head: Normocephalic and atraumatic.  Eyes:     Conjunctiva/sclera: Conjunctivae normal.  Neck:     Thyroid: No thyromegaly.  Cardiovascular:     Rate and Rhythm: Normal rate and regular rhythm.     Heart sounds: Normal heart sounds.  Pulmonary:     Effort: Pulmonary effort is normal.     Breath sounds: Normal breath sounds.  Abdominal:     Palpations: Abdomen is soft.  Skin:    General: Skin is warm and dry.  Neurological:     General: No focal deficit present.     Mental Status: She is alert and oriented to person, place, and time. Mental status is at baseline.  Psychiatric:        Mood and Affect: Mood normal.        Behavior: Behavior normal.        Thought Content: Thought  content normal.        Judgment: Judgment normal.         Assessment & Plan    1. ADD (attention deficit disorder) Restart Adderall at previous dose.  Only to use on workdays.  Return to clinic 4 to 6 months. - amphetamine-dextroamphetamine (ADDERALL) 10 MG tablet; Take 1 tablet (10 mg total) by mouth daily.  Dispense: 30 tablet; Refill: 0  2. Fibromyalgia This is been under good control for years.  At this time will go to every other day of 10 mg Pamelor and discontinue.  This is fine to do.  3. Anxiety Doing well.    I have done the exam and reviewed the above chart and it is accurate to the best of my knowledge. Development worker, community has been used in this note in any air is in the dictation or transcription are unintentional.  Wilhemena Durie, MD  [place]

## 2018-04-22 ENCOUNTER — Ambulatory Visit: Payer: 59 | Admitting: Family Medicine

## 2018-04-23 MED ORDER — AMPHETAMINE-DEXTROAMPHETAMINE 10 MG PO TABS: 10.0000 mg | ORAL_TABLET | Freq: Every day | ORAL | 0 refills | Status: DC

## 2018-04-23 MED ORDER — NORTRIPTYLINE HCL 10 MG PO CAPS: 10.0000 mg | ORAL_CAPSULE | ORAL | 3 refills | Status: DC

## 2018-05-05 ENCOUNTER — Other Ambulatory Visit: Payer: Self-pay | Admitting: Family Medicine

## 2018-05-05 DIAGNOSIS — F988 Other specified behavioral and emotional disorders with onset usually occurring in childhood and adolescence: Secondary | ICD-10-CM

## 2018-05-05 MED ORDER — AMPHETAMINE-DEXTROAMPHETAMINE 10 MG PO TABS: 10.0000 mg | ORAL_TABLET | Freq: Every day | ORAL | 0 refills | Status: DC

## 2018-08-16 ENCOUNTER — Encounter: Payer: Self-pay | Admitting: Family Medicine

## 2018-08-16 ENCOUNTER — Ambulatory Visit (INDEPENDENT_AMBULATORY_CARE_PROVIDER_SITE_OTHER): Payer: 59 | Admitting: Family Medicine

## 2018-08-16 ENCOUNTER — Other Ambulatory Visit: Payer: Self-pay

## 2018-08-16 VITALS — BP 125/87 | HR 73 | Temp 98.6°F | Wt 135.6 lb

## 2018-08-16 DIAGNOSIS — G629 Polyneuropathy, unspecified: Secondary | ICD-10-CM | POA: Diagnosis not present

## 2018-08-16 DIAGNOSIS — K219 Gastro-esophageal reflux disease without esophagitis: Secondary | ICD-10-CM

## 2018-08-16 DIAGNOSIS — F988 Other specified behavioral and emotional disorders with onset usually occurring in childhood and adolescence: Secondary | ICD-10-CM

## 2018-08-16 DIAGNOSIS — E785 Hyperlipidemia, unspecified: Secondary | ICD-10-CM | POA: Diagnosis not present

## 2018-08-16 DIAGNOSIS — I7300 Raynaud's syndrome without gangrene: Secondary | ICD-10-CM

## 2018-08-16 NOTE — Progress Notes (Signed)
Patient: Kathy Barnes Female    DOB: [birth year]   58 y.o.   MRN: [id number] Visit Date: 08/16/2018  Today's Provider: Wilhemena Durie, MD   Chief Complaint  Patient presents with  . Follow-up    ADD   Subjective:    HPI  ADD Patient presents today for her ADD follow-up. Patient is currently taking Adderall 10 MG daily. Patient reports good compliance with treatment.    Patient states that she has been having some bilateral toe numbness for about two weeks now.Just in  Tips of her toes.   Depression screen [facility] 2/9 08/16/2018  Decreased Interest 0  Down, Depressed, Hopeless 0  PHQ - 2 Score 0  Altered sleeping 0  Tired, decreased energy 1  Change in appetite 0  Feeling bad or failure about yourself  0  Trouble concentrating 0  Moving slowly or fidgety/restless 0  Suicidal thoughts 0  PHQ-9 Score 1  Difficult doing work/chores Not difficult at all   GAD 7 : Generalized Anxiety Score 08/16/2018  Nervous, Anxious, on Edge 1  Control/stop worrying 0  Worry too much - different things 0  Trouble relaxing 1  Restless 0  Easily annoyed or irritable 0  Afraid - awful might happen 0  Total GAD 7 Score 2  Anxiety Difficulty Not difficult at all      Allergies  Allergen Reactions  . Ciprofloxacin     Leg pain      Current Outpatient Medications:  .  amphetamine-dextroamphetamine (ADDERALL) 10 MG tablet, Take 1 tablet (10 mg total) by mouth daily., Disp: 30 tablet, Rfl: 0 .  amphetamine-dextroamphetamine (ADDERALL) 10 MG tablet, Take 1 tablet (10 mg total) by mouth daily., Disp: 30 tablet, Rfl: 0 .  amphetamine-dextroamphetamine (ADDERALL) 10 MG tablet, Take 1 tablet (10 mg total) by mouth daily., Disp: 30 tablet, Rfl: 0 .  nortriptyline (PAMELOR) 10 MG capsule, Take 1 capsule (10 mg total) by mouth every other day., Disp: 15 capsule, Rfl: 3 .  ALPRAZolam (XANAX) 0.25 MG tablet, Take by mouth., Disp: , Rfl:  .  MULTIPLE VITAMIN PO, Take by mouth., Disp: ,  Rfl:  .  nortriptyline (PAMELOR) 25 MG capsule, TAKE 2 CAPSULES BY MOUTH 2 TIMES A DAY (Patient not taking: Reported on 08/16/2018), Disp: 60 capsule, Rfl: 1 .  omeprazole (PRILOSEC) 20 MG capsule, Take 1 capsule (20 mg total) by mouth 2 (two) times daily before a meal. (Patient not taking: Reported on 08/16/2018), Disp: 30 capsule, Rfl: 3 .  omeprazole (PRILOSEC) 40 MG capsule, daily as needed. , Disp: , Rfl:  .  PREMARIN vaginal cream, APPLY A SMALL AMOUNT TO URETHRA EVERY NIGHT BEFORE BED (Patient not taking: Reported on 08/16/2018), Disp: 30 g, Rfl: 5 .  ranitidine (ZANTAC) 300 MG tablet, Take by mouth daily as needed. , Disp: , Rfl:   Review of Systems  Constitutional: Negative.   Respiratory: Negative.   Cardiovascular: Negative.   Gastrointestinal: Negative.   Genitourinary: Negative.   Neurological: Positive for numbness.  Psychiatric/Behavioral: Negative.     Social History   Tobacco Use  . Smoking status: Never Smoker  . Smokeless tobacco: Never Used  Substance Use Topics  . Alcohol use: No      Objective:   BP 125/87 (BP Location: Right Arm, Patient Position: Sitting, Cuff Size: Normal)   Pulse 73   Temp 98.6 F (37 C) (Oral)   Wt 135 lb 9.6 oz (61.5 kg)   LMP  07/23/2009   SpO2 99%   BMI 24.80 kg/m  Vitals:   08/16/18 0833  BP: 125/87  Pulse: 73  Temp: 98.6 F (37 C)  TempSrc: Oral  SpO2: 99%  Weight: 135 lb 9.6 oz (61.5 kg)     Physical Exam Vitals signs reviewed.  Constitutional:      Appearance: She is well-developed.  HENT:     Head: Normocephalic and atraumatic.  Eyes:     Conjunctiva/sclera: Conjunctivae normal.  Neck:     Thyroid: No thyromegaly.  Cardiovascular:     Rate and Rhythm: Normal rate and regular rhythm.     Heart sounds: Normal heart sounds.  Pulmonary:     Effort: Pulmonary effort is normal.     Breath sounds: Normal breath sounds.  Abdominal:     Palpations: Abdomen is soft.  Skin:    General: Skin is warm and dry.   Neurological:     General: No focal deficit present.     Mental Status: She is alert and oriented to person, place, and time. Mental status is at baseline.     Sensory: No sensory deficit.  Psychiatric:        Mood and Affect: Mood normal.        Behavior: Behavior normal.        Thought Content: Thought content normal.        Judgment: Judgment normal.      No results found for any visits on 08/16/18.     Assessment & Plan    1. Gastroesophageal reflux disease without esophagitis  - CBC with Differential/Platelet - Comprehensive metabolic panel  2. Attention deficit disorder, unspecified hyperactivity presence   3. Hyperlipidemia, unspecified hyperlipidemia type  - Lipid panel - TSH  4. Neuropathy I think this sounds more like raynauds than neuropath - Hemoglobin A1c - Vitamin B12 5.Raynauds phenomenon  Discussed with pt.No w/u or treatment at this time.    Kalifa Cadden Cranford Mon, MD  [facility]

## 2018-08-16 NOTE — Patient Instructions (Signed)
Take alpha lipoic acid and B12 daily.

## 2018-08-17 LAB — CBC WITH DIFFERENTIAL/PLATELET
Basophils Absolute: 0.1 10*3/uL (ref 0.0–0.2)
Basos: 1 %
EOS (ABSOLUTE): 0.1 10*3/uL (ref 0.0–0.4)
Eos: 1 %
Hematocrit: 42.5 % (ref 34.0–46.6)
Hemoglobin: 14.8 g/dL (ref 11.1–15.9)
Immature Grans (Abs): 0 10*3/uL (ref 0.0–0.1)
Immature Granulocytes: 0 %
Lymphocytes Absolute: 2 10*3/uL (ref 0.7–3.1)
Lymphs: 24 %
MCH: 32.6 pg (ref 26.6–33.0)
MCHC: 34.8 g/dL (ref 31.5–35.7)
MCV: 94 fL (ref 79–97)
Monocytes Absolute: 0.5 10*3/uL (ref 0.1–0.9)
Monocytes: 6 %
Neutrophils Absolute: 5.7 10*3/uL (ref 1.4–7.0)
Neutrophils: 68 %
Platelets: 245 10*3/uL (ref 150–450)
RBC: 4.54 x10E6/uL (ref 3.77–5.28)
RDW: 11.9 % (ref 11.7–15.4)
WBC: 8.4 10*3/uL (ref 3.4–10.8)

## 2018-08-17 LAB — COMPREHENSIVE METABOLIC PANEL
ALT: 10 IU/L (ref 0–32)
AST: 15 IU/L (ref 0–40)
Albumin/Globulin Ratio: 2.1 (ref 1.2–2.2)
Albumin: 4.4 g/dL (ref 3.8–4.9)
Alkaline Phosphatase: 62 IU/L (ref 39–117)
BUN/Creatinine Ratio: 15 (ref 9–23)
BUN: 13 mg/dL (ref 6–24)
Bilirubin Total: 0.2 mg/dL (ref 0.0–1.2)
CO2: 23 mmol/L (ref 20–29)
Calcium: 9.3 mg/dL (ref 8.7–10.2)
Chloride: 104 mmol/L (ref 96–106)
Creatinine, Ser: 0.87 mg/dL (ref 0.57–1.00)
GFR calc Af Amer: 85 mL/min/{1.73_m2} (ref >59)
GFR calc non Af Amer: 74 mL/min/{1.73_m2} (ref >59)
Globulin, Total: 2.1 g/dL (ref 1.5–4.5)
Glucose: 82 mg/dL (ref 65–99)
Potassium: 4.6 mmol/L (ref 3.5–5.2)
Sodium: 141 mmol/L (ref 134–144)
Total Protein: 6.5 g/dL (ref 6.0–8.5)

## 2018-08-17 LAB — LIPID PANEL
Chol/HDL Ratio: 3.6 ratio (ref 0.0–4.4)
Cholesterol, Total: 212 mg/dL — ABNORMAL HIGH (ref 100–199)
HDL: 59 mg/dL (ref >39)
LDL Calculated: 139 mg/dL — ABNORMAL HIGH (ref 0–99)
Triglycerides: 70 mg/dL (ref 0–149)
VLDL Cholesterol Cal: 14 mg/dL (ref 5–40)

## 2018-08-17 LAB — HEMOGLOBIN A1C
Est. average glucose Bld gHb Est-mCnc: 103 mg/dL
Hgb A1c MFr Bld: 5.2 % (ref 4.8–5.6)

## 2018-08-17 LAB — TSH: TSH: 2.01 u[IU]/mL (ref 0.450–4.500)

## 2018-08-17 LAB — VITAMIN B12: Vitamin B-12: 510 pg/mL (ref 232–1245)

## 2018-08-22 ENCOUNTER — Encounter: Payer: Self-pay | Admitting: Family Medicine

## 2018-09-04 ENCOUNTER — Telehealth: Payer: Self-pay | Admitting: Family Medicine

## 2018-09-04 DIAGNOSIS — Z20822 Contact with and (suspected) exposure to covid-19: Secondary | ICD-10-CM

## 2018-09-04 NOTE — Telephone Encounter (Signed)
Pt called saying she had "workers" in her house Thursday and Friday and she has heard that these workers have tested positive and she wants to know what to do  CB#  [phone] leave a messge if she does not answer  teri

## 2018-09-04 NOTE — Telephone Encounter (Signed)
Order covid test for exposure?

## 2018-09-04 NOTE — Telephone Encounter (Signed)
ok 

## 2018-09-05 NOTE — Telephone Encounter (Signed)
Scheduled patient for COVID 19 test tomorrow at [facility] protocol reviewed with patient.

## 2018-09-05 NOTE — Telephone Encounter (Signed)
Please order covid test for exposure. Thanks!

## 2018-09-06 ENCOUNTER — Other Ambulatory Visit: Payer: BLUE CROSS/BLUE SHIELD

## 2018-09-06 DIAGNOSIS — Z20822 Contact with and (suspected) exposure to covid-19: Secondary | ICD-10-CM

## 2018-09-10 LAB — NOVEL CORONAVIRUS, NAA: SARS-CoV-2, NAA: NOT DETECTED

## 2018-09-11 ENCOUNTER — Telehealth: Payer: Self-pay

## 2018-09-11 NOTE — Telephone Encounter (Signed)
[id number] 

## 2018-09-11 NOTE — Telephone Encounter (Signed)
-----   Message from Jerrol Banana., MD sent at 09/10/2018  8:40 AM EDT ----- No covid .

## 2018-09-12 NOTE — Telephone Encounter (Signed)
Patient was advised and states she seen result on MyChart.

## 2018-12-06 ENCOUNTER — Other Ambulatory Visit: Payer: Self-pay | Admitting: Family Medicine

## 2018-12-06 ENCOUNTER — Encounter: Payer: Self-pay | Admitting: Family Medicine

## 2018-12-06 MED ORDER — AMPHETAMINE-DEXTROAMPHETAMINE 10 MG PO TABS: 10.0000 mg | ORAL_TABLET | Freq: Every day | ORAL | 0 refills | Status: DC

## 2019-01-15 ENCOUNTER — Other Ambulatory Visit: Payer: Self-pay

## 2019-01-15 DIAGNOSIS — Z20822 Contact with and (suspected) exposure to covid-19: Secondary | ICD-10-CM

## 2019-01-17 LAB — NOVEL CORONAVIRUS, NAA: SARS-CoV-2, NAA: NOT DETECTED

## 2019-01-29 NOTE — Progress Notes (Signed)
Patient: Kathy Barnes Female    DOB: Dec 29, 1960   58 y.o.   MRN: [id number] Visit Date: 01/29/2019  Today's Provider: Wilhemena Durie, MD   No chief complaint on file.  Subjective:      1. Gastroesophageal reflux disease without esophagitis  - CBC with Differential/Platelet - Comprehensive metabolic panel  2. Attention deficit disorder, unspecified hyperactivity presence   3. Hyperlipidemia, unspecified hyperlipidemia type  - Lipid panel - TSH  4. Neuropathy I think this sounds more like raynauds than neuropath - Hemoglobin A1c - Vitamin B12 5.Raynauds phenomenon  Discussed with pt.No w/u or treatment at this time.  HPI  Allergies  Allergen Reactions  . Ciprofloxacin     Leg pain      Current Outpatient Medications:  .  ALPRAZolam (XANAX) 0.25 MG tablet, Take by mouth., Disp: , Rfl:  .  amphetamine-dextroamphetamine (ADDERALL) 10 MG tablet, Take 1 tablet (10 mg total) by mouth daily., Disp: 30 tablet, Rfl: 0 .  amphetamine-dextroamphetamine (ADDERALL) 10 MG tablet, Take 1 tablet (10 mg total) by mouth daily., Disp: 30 tablet, Rfl: 0 .  amphetamine-dextroamphetamine (ADDERALL) 10 MG tablet, Take 1 tablet (10 mg total) by mouth daily., Disp: 30 tablet, Rfl: 0 .  MULTIPLE VITAMIN PO, Take by mouth., Disp: , Rfl:  .  nortriptyline (PAMELOR) 10 MG capsule, Take 1 capsule (10 mg total) by mouth every other day., Disp: 15 capsule, Rfl: 3 .  nortriptyline (PAMELOR) 10 MG capsule, , Disp: , Rfl:  .  nortriptyline (PAMELOR) 25 MG capsule, TAKE 2 CAPSULES BY MOUTH 2 TIMES A DAY (Patient not taking: Reported on 08/16/2018), Disp: 60 capsule, Rfl: 1 .  omeprazole (PRILOSEC) 20 MG capsule, Take 1 capsule (20 mg total) by mouth 2 (two) times daily before a meal. (Patient not taking: Reported on 08/16/2018), Disp: 30 capsule, Rfl: 3 .  omeprazole (PRILOSEC) 40 MG capsule, daily as needed. , Disp: , Rfl:  .  PREMARIN vaginal cream, APPLY A SMALL AMOUNT TO URETHRA  EVERY NIGHT BEFORE BED (Patient not taking: Reported on 08/16/2018), Disp: 30 g, Rfl: 5 .  ranitidine (ZANTAC) 300 MG tablet, Take by mouth daily as needed. , Disp: , Rfl:   Review of Systems  Social History   Tobacco Use  . Smoking status: Never Smoker  . Smokeless tobacco: Never Used  Substance Use Topics  . Alcohol use: No      Objective:   LMP 07/23/2009  There were no vitals filed for this visit.There is no height or weight on file to calculate BMI.   Physical Exam   No results found for any visits on 02/01/19.     Assessment & Plan    1. Anxiety Start with low-dose. - nortriptyline (PAMELOR) 10 MG capsule; Take 1 capsule (10 mg total) by mouth 2 (two) times daily.  Dispense: 60 capsule; Refill: 11  2. Other insomnia 12 minute call. - nortriptyline (PAMELOR) 10 MG capsule; Take 1 capsule (10 mg total) by mouth 2 (two) times daily.  Dispense: 60 capsule; Refill: 11  3. Attention deficit disorder (ADD) without hyperactivity   4. ADD (attention deficit disorder) Well-controlled with Adderall - amphetamine-dextroamphetamine (ADDERALL) 10 MG tablet; Take 1 tablet (10 mg total) by mouth daily.  Dispense: 30 tablet; Refill: 0 - amphetamine-dextroamphetamine (ADDERALL) 10 MG tablet; Take 1 tablet (10 mg total) by mouth daily.  Dispense: 30 tablet; Refill: 0     Wilhemena Durie, MD  [facility]  [facility]

## 2019-02-01 ENCOUNTER — Telehealth (INDEPENDENT_AMBULATORY_CARE_PROVIDER_SITE_OTHER): Payer: 59 | Admitting: Family Medicine

## 2019-02-01 DIAGNOSIS — G4709 Other insomnia: Secondary | ICD-10-CM | POA: Diagnosis not present

## 2019-02-01 DIAGNOSIS — F988 Other specified behavioral and emotional disorders with onset usually occurring in childhood and adolescence: Secondary | ICD-10-CM | POA: Diagnosis not present

## 2019-02-01 DIAGNOSIS — F419 Anxiety disorder, unspecified: Secondary | ICD-10-CM

## 2019-02-01 MED ORDER — AMPHETAMINE-DEXTROAMPHETAMINE 10 MG PO TABS: 10.0000 mg | ORAL_TABLET | Freq: Every day | ORAL | 0 refills | Status: DC

## 2019-02-01 MED ORDER — NORTRIPTYLINE HCL 10 MG PO CAPS
10.0000 mg | ORAL_CAPSULE | Freq: Two times a day (BID) | ORAL | 11 refills | Status: DC
Start: 2019-02-01 — End: 2019-02-23

## 2019-02-23 ENCOUNTER — Other Ambulatory Visit: Payer: Self-pay | Admitting: Family Medicine

## 2019-02-23 DIAGNOSIS — G4709 Other insomnia: Secondary | ICD-10-CM

## 2019-02-23 DIAGNOSIS — F419 Anxiety disorder, unspecified: Secondary | ICD-10-CM

## 2019-03-26 ENCOUNTER — Other Ambulatory Visit: Payer: Self-pay | Admitting: Family Medicine

## 2019-03-26 DIAGNOSIS — F988 Other specified behavioral and emotional disorders with onset usually occurring in childhood and adolescence: Secondary | ICD-10-CM

## 2019-03-26 MED ORDER — AMPHETAMINE-DEXTROAMPHETAMINE 10 MG PO TABS: 10.0000 mg | ORAL_TABLET | Freq: Every day | ORAL | 0 refills | Status: DC

## 2019-04-04 ENCOUNTER — Ambulatory Visit: Payer: Self-pay | Admitting: *Deleted

## 2019-04-04 NOTE — Telephone Encounter (Signed)
Per initial encounter, "Pt stated she had her 2nd dose vaccine this morning. She was exposed last night to someone who tested positive for Covid 19. She would like to know if she needs to quarantine for 14 days or do something different";contacted pt to discuss concerns; tier of symptoms reviewed; pt informed it could take up to 14 days for symptoms to evolve, and she should monitor for them; instructions given to pt: Self-Isolation, Ending Isolation, ED/UC . remain in self-quarantine until they meet the "Non-Test Criteria for Ending Self-Isolation". Non-Test Criteria for Ending Self-Isolation All persons with fever and respiratory symptoms should isolate themselves until ALL conditions listed below are met: - at least 10 days since symptoms onset - AND 3 consecutive days fever free without antipyretics (acetaminophen [Tylenol] or ibuprofen [Advil]) - AND improvement in respiratory symptoms The pt was informed there are test sites that do not require MD's order but an appt is required; she was also encouraged to notify her PCP for additional recommendations; the pt verbalized understanding; she sees Dr Rosanna Randy, [facility]; will route to office for notification.  Reason for Disposition . General information question, no triage required and triager able to answer question  Answer Assessment - Initial Assessment Questions 1. REASON FOR CALL or QUESTION: "What is your reason for calling today?" or "How can I best help you?" or "What question do you have that I can help answer?"    COVID quarantine  Protocols used: [place]

## 2019-04-11 MED ORDER — AMPHETAMINE-DEXTROAMPHETAMINE 10 MG PO TABS: 10.0000 mg | ORAL_TABLET | Freq: Every day | ORAL | 0 refills | Status: DC

## 2019-04-11 NOTE — Addendum Note (Signed)
Addended by: Julieta Bellini on: 04/11/2019 09:46 AM   Modules accepted: Orders

## 2019-04-11 NOTE — Addendum Note (Signed)
Addended by: Julieta Bellini on: 04/11/2019 09:45 AM   Modules accepted: Orders

## 2019-04-11 NOTE — Telephone Encounter (Signed)
Please resend rx

## 2019-04-11 NOTE — Telephone Encounter (Signed)
Patient called in stating pharmacy advised her they still havent received the prescription. Please advise.

## 2019-05-04 ENCOUNTER — Ambulatory Visit (INDEPENDENT_AMBULATORY_CARE_PROVIDER_SITE_OTHER): Payer: 59 | Admitting: Adult Health

## 2019-05-04 ENCOUNTER — Encounter: Payer: Self-pay | Admitting: Adult Health

## 2019-05-04 DIAGNOSIS — Z20822 Contact with and (suspected) exposure to covid-19: Secondary | ICD-10-CM | POA: Diagnosis not present

## 2019-05-04 DIAGNOSIS — J069 Acute upper respiratory infection, unspecified: Secondary | ICD-10-CM | POA: Diagnosis not present

## 2019-05-04 NOTE — Patient Instructions (Signed)
Oxymetazoline nasal spray- no more than 3 days of use and not to use with Sudafed.  What is this medicine? Oxymetazoline (OX ee me TAZ oh leen) is a nasal decongestant. This medicine is used to treat nasal congestion or a stuffy nose. This medicine will not treat an infection. This medicine may be used for other purposes; ask your health care provider or pharmacist if you have questions. COMMON BRAND NAME(S): 12 Hour Nasal, Afrin, Afrin Extra Moisturizing, Afrin Nasal Sinus, Afrin No Drip Severe Congestion, Dristan, Duration, Genasal, Mucinex Children's Stuffy Nose, Mucinex Full Force, Mucinex Moisture Smart, Mucinex Sinus-Max, Mucinex Sinus-Max Sinus & Allergy, NASAL Decongestant, Nasal Relief, Neo-Synephrine 12-Hour, Neo-Synephrine Severe Sinus Congestion, Nostrilla Fast Relief, Sinex 12-Hour, Sudafed OM Sinus Cold Moisturizing, Sudafed OM Sinus Congestion Moisturizing, Vicks Qlearquil Decongestant, Vicks Sinex, Vicks Sinex Severe, Vicks Sinus Daytime, Zicam Extreme Congestion Relief, Zicam Intense Sinus What should I tell my health care provider before I take this medicine? They need to know if you have any of these conditions:  diabetes  glaucoma  heart disease  high or low blood pressure  history of stroke  Raynaud's phenomenon  scleroderma  Sjogren's syndrome  thromboangiitis obliterans  thyroid disease  trouble urinating due to an enlarged prostate gland  an unusual or allergic reaction to oxymetazoline, other medicines, foods, dyes, or preservatives  pregnant or trying to get pregnant  breast-feeding How should I use this medicine? This medicine is for use in the nose. Do not take by mouth. Follow the directions on the package label. Shake well before using. Use your medicine at regular intervals or as directed by your health care provider. Do not use it more often than directed. Do not use for more than 3 days in a row without advice. Make sure that you are using your  nasal spray correctly. Ask your doctor or health care provider if you have any questions. Talk to your pediatrician regarding the use of this medicine in children. While this drug may be prescribed for children as young as 6 years for selected conditions, precautions do apply. Overdosage: If you think you have taken too much of this medicine contact a poison control center or emergency room at once. NOTE: This medicine is only for you. Do not share this medicine with others. What if I miss a dose? If you miss a dose, use it as soon as you can. If it is almost time for your next dose, use only that dose. Do not use double or extra doses. What may interact with this medicine? The medicine may interaction with the following medications:  MAOIs like isocarboxazid, phenelzine, rasagiline, selegiline, and tranylcypromine  medicines to treat blood pressure and heart disease like ace-inhibitors, beta-blockers, calcium-channel blockers, digoxin, and diuretics  medicines to treat enlarged prostate like alfuzosin, doxazosin, prazosin, and terazosin  nafarelin This list may not describe all possible interactions. Give your health care provider a list of all the medicines, herbs, non-prescription drugs, or dietary supplements you use. Also tell them if you smoke, drink alcohol, or use illegal drugs. Some items may interact with your medicine. What should I watch for while using this medicine? Tell your doctor or health care professional if your symptoms do not start to get better or if they get worse. To prevent the spread of infection, do not share bottle with anyone else. What side effects may I notice from receiving this medicine? Side effects that you should report to your doctor or health care professional as soon as  possible:  allergic reactions like skin rash, itching or hives, swelling of the face, lips, or tongue Side effects that usually do not require medical attention (report to your doctor or  health care professional if they continue or are bothersome):  burning, stinging, or irritation in the nose right after use  increased nasal discharge  sneezing This list may not describe all possible side effects. Call your doctor for medical advice about side effects. You may report side effects to FDA at 1-800-FDA-1088. Where should I keep my medicine? Keep out of the reach of children. Store at room temperature between 20 and 25 degrees C (68 and 77 degrees F). Throw away any unused medicine after the expiration date. NOTE: This sheet is a summary. It may not cover all possible information. If you have questions about this medicine, talk to your doctor, pharmacist, or health care provider.  2020 Elsevier/Gold Standard (2015-04-03 13:48:04) Fluticasone nasal spray What is this medicine? FLUTICASONE (floo TIK a sone) is a corticosteroid. This medicine is used to treat the symptoms of allergies like sneezing, itchy red eyes, and itchy, runny, or stuffy nose. This medicine is also used to treat nasal polyps. This medicine may be used for other purposes; ask your health care provider or pharmacist if you have questions. COMMON BRAND NAME(S): ClariSpray, Flonase, Flonase Allergy Relief, Flonase Sensimist, Veramyst, XHANCE What should I tell my health care provider before I take this medicine? They need to know if you have any of these conditions:  eye disease, vision problems  infection, like tuberculosis, herpes, or fungal infection  recent surgery on nose or sinuses  taking a corticosteroid by mouth  an unusual or allergic reaction to fluticasone, steroids, other medicines, foods, dyes, or preservatives  pregnant or trying to get pregnant  breast-feeding How should I use this medicine? This medicine is for use in the nose. Follow the directions on your product or prescription label. This medicine works best if used at regular intervals. Do not use more often than directed. Make  sure that you are using your nasal spray correctly. After 6 months of daily use for allergies, talk to your doctor or health care professional before using it for a longer time. Ask your doctor or health care professional if you have any questions. Talk to your pediatrician regarding the use of this medicine in children. Special care may be needed. Some products have been used for allergies in children as young as 2 years. After 2 months of daily use without a prescription in a child, talk to your pediatrician before using it for a longer time. Use of this medicine for nasal polyps is not approved in children. Overdosage: If you think you have taken too much of this medicine contact a poison control center or emergency room at once. NOTE: This medicine is only for you. Do not share this medicine with others. What if I miss a dose? If you miss a dose, use it as soon as you remember. If it is almost time for your next dose, use only that dose and continue with your regular schedule. Do not use double or extra doses. What may interact with this medicine?  certain antibiotics like clarithromycin and telithromycin  certain medicines for fungal infections like ketoconazole, itraconazole, and voriconazole  conivaptan  nefazodone  some medicines for HIV  vaccines This list may not describe all possible interactions. Give your health care provider a list of all the medicines, herbs, non-prescription drugs, or dietary supplements you use.  Also tell them if you smoke, drink alcohol, or use illegal drugs. Some items may interact with your medicine. What should I watch for while using this medicine? Visit your healthcare professional for regular checks on your progress. Tell your healthcare professional if your symptoms do not start to get better or if they get worse. This medicine may increase your risk of getting an infection. Tell your doctor or health care professional if you are around anyone with  measles or chickenpox, or if you develop sores or blisters that do not heal properly. What side effects may I notice from receiving this medicine? Side effects that you should report to your doctor or health care professional as soon as possible:  allergic reactions like skin rash, itching or hives, swelling of the face, lips, or tongue  changes in vision  crusting or sores in the nose  nosebleed  signs and symptoms of infection like fever or chills; cough; sore throat  white patches or sores in the mouth or nose Side effects that usually do not require medical attention (report to your doctor or health care professional if they continue or are bothersome):  burning or irritation inside the nose or throat  changes in taste or smell  cough  headache This list may not describe all possible side effects. Call your doctor for medical advice about side effects. You may report side effects to FDA at 1-800-FDA-1088. Where should I keep my medicine? Keep out of the reach of children. Store at room temperature between 15 and 30 degrees C (59 and 86 degrees F). Avoid exposure to extreme heat, cold, or light. Throw away any unused medicine after the expiration date. NOTE: This sheet is a summary. It may not cover all possible information. If you have questions about this medicine, talk to your doctor, pharmacist, or health care provider.  2020 Elsevier/Gold Standard (2017-03-03 14:10:08) Postnasal Drip Postnasal drip is the feeling of mucus going down the back of your throat. Mucus is a slimy substance that moistens and cleans your nose and throat, as well as the air pockets in face bones near your forehead and cheeks (sinuses). Small amounts of mucus pass from your nose and sinuses down the back of your throat all the time. This is normal. When you produce too much mucus or the mucus gets too thick, you can feel it. Some common causes of postnasal drip include:  Having more mucus because  of: ? A cold or the flu. ? Allergies. ? Cold air. ? Certain medicines.  Having more mucus that is thicker because of: ? A sinus or nasal infection. ? Dry air. ? A food allergy. Follow these instructions at home: Relieving discomfort   Gargle with a salt-water mixture 3-4 times a day or as needed. To make a salt-water mixture, completely dissolve -1 tsp of salt in 1 cup of warm water.  If the air in your home is dry, use a humidifier to add moisture to the air.  Use a saline spray or container (neti pot) to flush out the nose (nasal irrigation). These methods can help clear away mucus and keep the nasal passages moist. General instructions  Take over-the-counter and prescription medicines only as told by your health care provider.  Follow instructions from your health care provider about eating or drinking restrictions. You may need to avoid caffeine.  Avoid things that you know you are allergic to (allergens), like dust, mold, pollen, pets, or certain foods.  Drink enough fluid to keep  your urine pale yellow.  Keep all follow-up visits as told by your health care provider. This is important. Contact a health care provider if:  You have a fever.  You have a sore throat.  You have difficulty swallowing.  You have headache.  You have sinus pain.  You have a cough that does not go away.  The mucus from your nose becomes thick and is green or yellow in color.  You have cold or flu symptoms that last more than 10 days. Summary  Postnasal drip is the feeling of mucus going down the back of your throat.  If your health care provider approves, use nasal irrigation or a nasal spray 2?4 times a day.  Avoid things that you know you are allergic to (allergens), like dust, mold, pollen, pets, or certain foods. This information is not intended to replace advice given to you by your health care provider. Make sure you discuss any questions you have with your health care  provider. Document Revised: 06/02/2018 Document Reviewed: 05/24/2016 Elsevier Patient Education  [place]. Upper Respiratory Infection, Adult An upper respiratory infection (URI) affects the nose, throat, and upper air passages. URIs are caused by germs (viruses). The most common type of URI is often called "the common cold." Medicines cannot cure URIs, but you can do things at home to relieve your symptoms. URIs usually get better within 7-10 days. Follow these instructions at home: Activity  Rest as needed.  If you have a fever, stay home from work or school until your fever is gone, or until your doctor says you may return to work or school. ? You should stay home until you cannot spread the infection anymore (you are not contagious). ? Your doctor may have you wear a face mask so you have less risk of spreading the infection. Relieving symptoms  Gargle with a salt-water mixture 3-4 times a day or as needed. To make a salt-water mixture, completely dissolve -1 tsp of salt in 1 cup of warm water.  Use a cool-mist humidifier to add moisture to the air. This can help you breathe more easily. Eating and drinking   Drink enough fluid to keep your pee (urine) pale yellow.  Eat soups and other clear broths. General instructions   Take over-the-counter and prescription medicines only as told by your doctor. These include cold medicines, fever reducers, and cough suppressants.  Do not use any products that contain nicotine or tobacco. These include cigarettes and e-cigarettes. If you need help quitting, ask your doctor.  Avoid being where people are smoking (avoid secondhand smoke).  Make sure you get regular shots and get the flu shot every year.  Keep all follow-up visits as told by your doctor. This is important. How to avoid spreading infection to others   Wash your hands often with soap and water. If you do not have soap and water, use hand sanitizer.  Avoid  touching your mouth, face, eyes, or nose.  Cough or sneeze into a tissue or your sleeve or elbow. Do not cough or sneeze into your hand or into the air. Contact a doctor if:  You are getting worse, not better.  You have any of these: ? A fever. ? Chills. ? Brown or red mucus in your nose. ? Yellow or brown fluid (discharge)coming from your nose. ? Pain in your face, especially when you bend forward. ? Swollen neck glands. ? Pain with swallowing. ? White areas in the back of your throat.  Get help right away if:  You have shortness of breath that gets worse.  You have very bad or constant: ? Headache. ? Ear pain. ? Pain in your forehead, behind your eyes, and over your cheekbones (sinus pain). ? Chest pain.  You have long-lasting (chronic) lung disease along with any of these: ? Wheezing. ? Long-lasting cough. ? Coughing up blood. ? A change in your usual mucus.  You have a stiff neck.  You have changes in your: ? Vision. ? Hearing. ? Thinking. ? Mood. Summary  An upper respiratory infection (URI) is caused by a germ called a virus. The most common type of URI is often called "the common cold."  URIs usually get better within 7-10 days.  Take over-the-counter and prescription medicines only as told by your doctor. This information is not intended to replace advice given to you by your health care provider. Make sure you discuss any questions you have with your health care provider. Document Revised: 02/16/2018 Document Reviewed: 10/01/2016 Elsevier Patient Education  [place].

## 2019-05-04 NOTE — Progress Notes (Signed)
Patient: Kathy Barnes Female    DOB: 10-28-1960   59 y.o.   MRN: [id number] Visit Date: 05/04/2019  Today's Provider: Marcille Buffy, FNP   Chief Complaint  Patient presents with  . Sinus Problem   Subjective:    Virtual Visit via Video Note  I connected with Kathy Barnes on 05/04/19 at  3:40 PM EST by a video enabled telemedicine application and verified that I am speaking with the correct person using two identifiers.  Location: Patient: at home  Provider: [facility]    I discussed the limitations of evaluation and management by telemedicine and the availability of in person appointments. The patient expressed understanding and agreed to proceed.  Follow Up Instructions:    I discussed the assessment and treatment plan with the patient. The patient was provided an opportunity to ask questions and all were answered. The patient agreed with the plan and demonstrated an understanding of the instructions.   The patient was advised to call back or seek an in-person evaluation if the symptoms worsen or if the condition fails to improve as anticipated.   Sinus Problem This is a new problem. The current episode started in the past 7 days. There has been no fever. The pain is moderate. Associated symptoms include congestion, coughing, diaphoresis, headaches, a hoarse voice, neck pain, sinus pressure, a sore throat and swollen glands. Past treatments include oral decongestants and acetaminophen. The treatment provided mild relief.   She reports she has had both Covid vaccines. Covid test was negative. Husband became sick with bronchial cough and has shortness of breath with cough. Diarrhea.   Monday 04/30/2018 symptom onset. She received her 2nd vaccine for covid over one month ago. She has ear pain bilaterally. She has tried tylenol and sudafed. Temperature 99.3. She has very tender sore throat. She has less postnasal drip than she did previously.  Painful sore throat has improved since onset.She has a scratchy sore throat. She has mild headache. Symptoms have improved since onset. Denies any rash. Ears are improving with sudafed she reports. She is not taking any other medications over the counter.  She has no chest pain. She denies any shortness of breath.  She is working with [employer].    Her daughter is also sick.  They recently traveled to [place] and three people are sick that went. No known exposure to covid.   Patient  denies any body aches,chills, rash, chest pain, shortness of breath, nausea, vomiting, or diarrhea.     Allergies  Allergen Reactions  . Ciprofloxacin     Leg pain      Current Outpatient Medications:  .  amphetamine-dextroamphetamine (ADDERALL) 10 MG tablet, Take 1 tablet (10 mg total) by mouth daily., Disp: 30 tablet, Rfl: 0 .  MULTIPLE VITAMIN PO, Take by mouth., Disp: , Rfl:  .  nortriptyline (PAMELOR) 10 MG capsule, TAKE 1 CAPSULE (10 MG TOTAL) BY MOUTH 2 (TWO) TIMES DAILY., Disp: 180 capsule, Rfl: 4  Review of Systems  Constitutional: Positive for diaphoresis.  HENT: Positive for congestion, hoarse voice, rhinorrhea, sinus pressure and sore throat.   Respiratory: Positive for cough.   Musculoskeletal: Positive for neck pain.  Neurological: Positive for headaches.    Social History   Tobacco Use  . Smoking status: Never Smoker  . Smokeless tobacco: Never Used  Substance Use Topics  . Alcohol use: No      Objective:   LMP 07/23/2009  There  were no vitals filed for this visit.There is no height or weight on file to calculate BMI.   Physical Exam   Patient is alert and oriented and responsive to questions Engages in conversation with provider. Speaks in full sentences without any pauses without any shortness of breath or distress.   No results found for any visits on 05/04/19.     Assessment & Plan    1. Viral upper respiratory tract infection with cough Discussed Flonase  use as per package instructions, she will get OTC. Nasal saline rinse for nasal congestion. Tylenol per package instructions or Motrin 600 mg every 8 hours for throat pain/ headache. Monitoring temperature discussed. RED flag symptoms reviewed and when to follow back up or seek care immediately. Advise to be covid tested given COVID 19 pandemic.  She will go.  Discussed viral versus bacterial sinusitis and when to call back if needs antibiotics at least 10 - 12 days. Patient verbalized understanding of all instructions given and denies any further questions at this time.  Offered Prednisone dose pack for throat and ear pain/ congestion nasally - she declined at this time.  Will call back if she would like.  Covid Testing Instructions for [place]:  If you/your practice has a patient that needs an appointment, please have the patient text "COVID" to [phone], OR they can log on to [email or web address] to easily make an on-line appointment. Please note:  If you have a patient who does not have access to a smart phone or PC, you or the patient can call [phone] to get assistance.    2. Suspected COVID-19 virus infection As above testing advised.   Advised patient call the office or your primary care [facility] for an appointment if no improvement within 72 hours or if any symptoms change or worsen at any time  Advised ER or urgent Care if after hours or on weekend. Call 911 for emergency symptoms at any time.Patinet verbalized understanding of all instructions given/reviewed and treatment plan and has no further questions or concerns at this time.    Return if symptoms worsen or fail to improve, for at any time for any worsening symptoms, Go to Emergency room/ urgent care if worse.   The entirety of the information documented in the History of Present Illness, Review of Systems and Physical Exam were personally obtained by me. Portions of this information were initially documented by the   Certified Medical Assistant whose name is documented in [place] and reviewed by me for thoroughness and accuracy.  I have personally performed the exam and reviewed the chart and it is accurate to the best of my knowledge.  Haematologist has been used and any errors in dictation or transcription are unintentional.  Kelby Aline. Flinchum FNP-C  [facility]    I provided  25 minutes of non-face-to-face time during this encounter. Marcille Buffy, FNP  [facility],

## 2019-05-06 ENCOUNTER — Encounter: Payer: Self-pay | Admitting: Family Medicine

## 2019-05-09 ENCOUNTER — Encounter: Payer: Self-pay | Admitting: Family Medicine

## 2019-05-24 ENCOUNTER — Other Ambulatory Visit: Payer: Self-pay | Admitting: Family Medicine

## 2019-05-24 DIAGNOSIS — F988 Other specified behavioral and emotional disorders with onset usually occurring in childhood and adolescence: Secondary | ICD-10-CM

## 2019-05-24 MED ORDER — AMPHETAMINE-DEXTROAMPHETAMINE 10 MG PO TABS: 10.0000 mg | ORAL_TABLET | Freq: Every day | ORAL | 0 refills | Status: DC

## 2019-06-19 ENCOUNTER — Telehealth: Payer: Self-pay

## 2019-06-19 ENCOUNTER — Encounter: Payer: Self-pay | Admitting: Family Medicine

## 2019-06-19 NOTE — Telephone Encounter (Signed)
Copied from [place] [id number]. Topic: General - Inquiry >> Jun 19, 2019 11:11 AM Mathis Bud wrote: Reason for CRM: Patient believes she has a UTI.  Patient is requesting medication for this. Patient does not want to come into the office due to she currently has a stomach bug. Patient states she has had UTI before and if PCP can call in the same medication. Call back [phone] [facility]ARM[id number] [place], [place]  Phone:  [phone] Fax:  [phone]

## 2019-06-19 NOTE — Telephone Encounter (Signed)
Please advise 

## 2019-06-20 NOTE — Telephone Encounter (Signed)
Spoke to patient and she said she was able to do a televisit last night and get some medication.

## 2019-06-20 NOTE — Telephone Encounter (Signed)
We are happy to see her

## 2019-07-03 ENCOUNTER — Other Ambulatory Visit: Payer: Self-pay | Admitting: Physician Assistant

## 2019-07-03 DIAGNOSIS — F988 Other specified behavioral and emotional disorders with onset usually occurring in childhood and adolescence: Secondary | ICD-10-CM

## 2019-07-03 MED ORDER — AMPHETAMINE-DEXTROAMPHETAMINE 10 MG PO TABS: 10.0000 mg | ORAL_TABLET | Freq: Every day | ORAL | 0 refills | Status: DC

## 2019-08-17 ENCOUNTER — Other Ambulatory Visit: Payer: Self-pay | Admitting: Family Medicine

## 2019-08-17 DIAGNOSIS — F988 Other specified behavioral and emotional disorders with onset usually occurring in childhood and adolescence: Secondary | ICD-10-CM

## 2019-08-20 MED ORDER — AMPHETAMINE-DEXTROAMPHETAMINE 10 MG PO TABS: 10.0000 mg | ORAL_TABLET | Freq: Every day | ORAL | 0 refills | Status: DC

## 2019-10-09 ENCOUNTER — Other Ambulatory Visit: Payer: Self-pay | Admitting: Family Medicine

## 2019-10-09 DIAGNOSIS — F988 Other specified behavioral and emotional disorders with onset usually occurring in childhood and adolescence: Secondary | ICD-10-CM

## 2019-10-10 MED ORDER — AMPHETAMINE-DEXTROAMPHETAMINE 10 MG PO TABS: 10.0000 mg | ORAL_TABLET | Freq: Every day | ORAL | 0 refills | Status: DC

## 2019-10-10 NOTE — Telephone Encounter (Signed)
We will fill this today.  She will need an appointment in the future for further refills

## 2019-11-27 ENCOUNTER — Other Ambulatory Visit: Payer: Self-pay | Admitting: Family Medicine

## 2019-11-27 DIAGNOSIS — F988 Other specified behavioral and emotional disorders with onset usually occurring in childhood and adolescence: Secondary | ICD-10-CM

## 2019-11-27 MED ORDER — AMPHETAMINE-DEXTROAMPHETAMINE 10 MG PO TABS: 10.0000 mg | ORAL_TABLET | Freq: Every day | ORAL | 0 refills | Status: DC

## 2020-01-08 ENCOUNTER — Other Ambulatory Visit: Payer: Self-pay | Admitting: Family Medicine

## 2020-01-08 DIAGNOSIS — F988 Other specified behavioral and emotional disorders with onset usually occurring in childhood and adolescence: Secondary | ICD-10-CM

## 2020-01-09 MED ORDER — AMPHETAMINE-DEXTROAMPHETAMINE 10 MG PO TABS: 10.0000 mg | ORAL_TABLET | Freq: Every day | ORAL | 0 refills | Status: DC

## 2020-01-25 ENCOUNTER — Other Ambulatory Visit: Payer: Self-pay | Admitting: Obstetrics and Gynecology

## 2020-01-25 DIAGNOSIS — E2839 Other primary ovarian failure: Secondary | ICD-10-CM

## 2020-03-09 ENCOUNTER — Other Ambulatory Visit: Payer: Self-pay | Admitting: Family Medicine

## 2020-03-09 DIAGNOSIS — F988 Other specified behavioral and emotional disorders with onset usually occurring in childhood and adolescence: Secondary | ICD-10-CM

## 2020-03-12 ENCOUNTER — Telehealth (INDEPENDENT_AMBULATORY_CARE_PROVIDER_SITE_OTHER): Payer: 59 | Admitting: Family Medicine

## 2020-03-12 ENCOUNTER — Ambulatory Visit: Payer: 59 | Admitting: Family Medicine

## 2020-03-12 DIAGNOSIS — E7800 Pure hypercholesterolemia, unspecified: Secondary | ICD-10-CM | POA: Diagnosis not present

## 2020-03-12 DIAGNOSIS — M797 Fibromyalgia: Secondary | ICD-10-CM

## 2020-03-12 DIAGNOSIS — F419 Anxiety disorder, unspecified: Secondary | ICD-10-CM | POA: Diagnosis not present

## 2020-03-12 DIAGNOSIS — G4709 Other insomnia: Secondary | ICD-10-CM

## 2020-03-12 MED ORDER — NORTRIPTYLINE HCL 10 MG PO CAPS: 10.0000 mg | ORAL_CAPSULE | Freq: Every day | ORAL | 4 refills | Status: DC

## 2020-03-12 NOTE — Progress Notes (Deleted)
Established patient visit   Patient: Kathy Barnes   DOB: [birth year]   60 y.o. Female  MRN: [id number] Visit Date: 03/12/2020  Today's healthcare provider: Wilhemena Durie, MD   No chief complaint on file.  Subjective    HPI  Follow up for ADD  The patient was last seen for this 1 years ago. Changes made at last visit include no changes.  She reports {excellent/good/fair/poor:19665} compliance with treatment. She feels that condition is {improved/worse/unchanged:3041574}. She {is/is not:21021397} having side effects. ***  ----------------------------------------------------------------------------------------- Anxiety, Follow-up  She was last seen for anxiety {NUMBERS 1-12:18279} {days/wks/mos/yrs:310907} ago. Changes made at last visit include ***.   She reports {excellent/good/fair/poor:19665} compliance with treatment. She reports {good/fair/poor:18685} tolerance of treatment. She {is/is not:21021397} having side effects. {document side effects if present:1}  She feels her anxiety is {Desc; severity:60313} and {improved/worse/unchanged:3041574} since last visit.  Symptoms: {Yes/No:20286} chest pain {Yes/No:20286} difficulty concentrating  {Yes/No:20286} dizziness {Yes/No:20286} fatigue  {Yes/No:20286} feelings of losing control {Yes/No:20286} insomnia  {Yes/No:20286} irritable {Yes/No:20286} palpitations  {Yes/No:20286} panic attacks {Yes/No:20286} racing thoughts  {Yes/No:20286} shortness of breath {Yes/No:20286} sweating  {Yes/No:20286} tremors/shakes    GAD-7 Results GAD-7 Generalized Anxiety Disorder Screening Tool 08/16/2018  1. Feeling Nervous, Anxious, or on Edge 1  2. Not Being Able to Stop or Control Worrying 0  3. Worrying Too Much About Different Things 0  4. Trouble Relaxing 1  5. Being So Restless it's Hard To Sit Still 0  6. Becoming Easily Annoyed or Irritable 0  7. Feeling Afraid As If Something Awful Might Happen 0  Total GAD-7 Score 2   Difficulty At Work, Home, or Getting  Along With Others? Not difficult at all    PHQ-9 Scores PHQ9 SCORE ONLY 08/16/2018  PHQ-9 Total Score 1    ---------------------------------------------------------------------------------------------------    Patient Active Problem List   Diagnosis Date Noted  . Special screening for malignant neoplasms, colon   . Benign neoplasm of sigmoid colon   . Heartburn   . Gastritis   . Anxiety 07/09/2014  . HLD (hyperlipidemia) 07/09/2014  . Cannot sleep 07/09/2014  . Allergic rhinitis, seasonal 07/09/2014  . GERD (gastroesophageal reflux disease) 05/03/2014  . 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 Fibromyalgia   . Psoriasis    Social History   Tobacco Use  . Smoking status: Never Smoker  . Smokeless tobacco: Never Used  Substance Use Topics  . Alcohol use: No  . Drug use: No   Allergies  Allergen Reactions  . Ciprofloxacin     Leg pain        Medications: Outpatient Medications Prior to Visit  Medication Sig  . amphetamine-dextroamphetamine (ADDERALL) 10 MG tablet Take 1 tablet (10 mg total) by mouth daily.  . MULTIPLE VITAMIN PO Take by mouth.  . nortriptyline (PAMELOR) 10 MG capsule TAKE 1 CAPSULE (10 MG TOTAL) BY MOUTH 2 (TWO) TIMES DAILY.   No facility-administered medications prior to visit.    Review of Systems  Last CBC Lab Results  Component Value Date   WBC 8.4 08/16/2018   HGB 14.8 08/16/2018   HCT 42.5 08/16/2018   MCV 94 08/16/2018   MCH 32.6 08/16/2018   RDW 11.9 08/16/2018   PLT 245 95/62/1308   Last metabolic panel Lab Results  Component Value Date   GLUCOSE 82 08/16/2018   NA 141 08/16/2018   K 4.6 08/16/2018   CL 104 08/16/2018   CO2 23 08/16/2018  BUN 13 08/16/2018   CREATININE 0.87 08/16/2018   GFRNONAA 74 08/16/2018   GFRAA 85 08/16/2018   CALCIUM 9.3 08/16/2018   PROT 6.5 08/16/2018   ALBUMIN 4.4 08/16/2018    LABGLOB 2.1 08/16/2018   AGRATIO 2.1 08/16/2018   BILITOT 0.2 08/16/2018   ALKPHOS 62 08/16/2018   AST 15 08/16/2018   ALT 10 08/16/2018   Last lipids Lab Results  Component Value Date   CHOL 212 (H) 08/16/2018   HDL 59 08/16/2018   LDLCALC 139 (H) 08/16/2018   TRIG 70 08/16/2018   CHOLHDL 3.6 08/16/2018   Last hemoglobin A1c Lab Results  Component Value Date   HGBA1C 5.2 08/16/2018   Last thyroid functions Lab Results  Component Value Date   TSH 2.010 08/16/2018       Objective    LMP 07/23/2009  BP Readings from Last 3 Encounters:  08/16/18 125/87  04/20/18 112/80  12/26/17 122/80   Wt Readings from Last 3 Encounters:  08/16/18 135 lb 9.6 oz (61.5 kg)  04/20/18 141 lb (64 kg)  12/26/17 148 lb (67.1 kg)       Physical Exam  ***  No results found for any visits on 03/12/20.  Assessment & Plan     ***  No follow-ups on file.      {provider attestation***:1}   Wilhemena Durie, MD  [facility] [phone] (phone) [phone] (fax)  [place]

## 2020-03-12 NOTE — Progress Notes (Signed)
Virtual Visit via Telephone Note  I connected with Kathy Barnes on 03/12/20 at  3:20 PM EST by telephone and verified that I am speaking with the correct person using two identifiers.  Location: Patient: Home Provider: Office   I discussed the limitations, risks, security and privacy concerns of performing an evaluation and management service by telephone and the availability of in person appointments. I also discussed with the patient that there may be a patient responsible charge related to this service. The patient expressed understanding and agreed to proceed.   History of Present Illness: Patient patient had a life insurance upgrade and was found to have a total cholesterol of 206 with HDL of 51 and LDL of 124.  No history of vascular disease.  She just wonders if she needs to do anything about this. She also needs refill on her nortriptyline which she takes 10 mg nightly. Overall she is feeling well.   Observations/Objective: She is alert and oriented and has no problems today.  He is having no cardiovascular issues.  Assessment and Plan: 1. Fibromyalgia Refill nortriptyline at 10 mg nightly.  This is been sent in.  2. Pure hypercholesterolemia Work on diet and exercise and will recheck.  LDL level of 124 with no vascular disease or diabetes is fine.  3. Anxiety  - nortriptyline (PAMELOR) 10 MG capsule; Take 1 capsule (10 mg total) by mouth at bedtime.  Dispense: 90 capsule; Refill: 4  4. Other insomnia  - nortriptyline (PAMELOR) 10 MG capsule; Take 1 capsule (10 mg total) by mouth at bedtime.  Dispense: 90 capsule; Refill: 4   Follow Up Instructions:    I discussed the assessment and treatment plan with the patient. The patient was provided an opportunity to ask questions and all were answered. The patient agreed with the plan and demonstrated an understanding of the instructions.   The patient was advised to call back or seek an in-person evaluation if the symptoms  worsen or if the condition fails to improve as anticipated.  I provided 12 minutes of non-face-to-face time during this encounter.   Wilhemena Durie, MD

## 2020-03-14 ENCOUNTER — Other Ambulatory Visit: Payer: Self-pay | Admitting: Family Medicine

## 2020-03-14 DIAGNOSIS — F988 Other specified behavioral and emotional disorders with onset usually occurring in childhood and adolescence: Secondary | ICD-10-CM

## 2020-03-14 MED ORDER — AMPHETAMINE-DEXTROAMPHETAMINE 10 MG PO TABS: 10.0000 mg | ORAL_TABLET | Freq: Every day | ORAL | 0 refills | Status: DC

## 2020-03-16 ENCOUNTER — Other Ambulatory Visit: Payer: Self-pay | Admitting: Family Medicine

## 2020-03-16 DIAGNOSIS — G4709 Other insomnia: Secondary | ICD-10-CM

## 2020-03-16 DIAGNOSIS — F419 Anxiety disorder, unspecified: Secondary | ICD-10-CM

## 2020-04-28 ENCOUNTER — Other Ambulatory Visit: Payer: Self-pay | Admitting: Family Medicine

## 2020-04-28 DIAGNOSIS — F988 Other specified behavioral and emotional disorders with onset usually occurring in childhood and adolescence: Secondary | ICD-10-CM

## 2020-04-30 ENCOUNTER — Other Ambulatory Visit: Payer: Self-pay | Admitting: Family Medicine

## 2020-04-30 DIAGNOSIS — F988 Other specified behavioral and emotional disorders with onset usually occurring in childhood and adolescence: Secondary | ICD-10-CM

## 2020-05-01 MED ORDER — AMPHETAMINE-DEXTROAMPHETAMINE 10 MG PO TABS: 10.0000 mg | ORAL_TABLET | Freq: Every day | ORAL | 0 refills | Status: DC

## 2020-06-08 ENCOUNTER — Other Ambulatory Visit: Payer: Self-pay | Admitting: Family Medicine

## 2020-06-08 DIAGNOSIS — F988 Other specified behavioral and emotional disorders with onset usually occurring in childhood and adolescence: Secondary | ICD-10-CM

## 2020-06-11 MED ORDER — AMPHETAMINE-DEXTROAMPHETAMINE 10 MG PO TABS
10.0000 mg | ORAL_TABLET | Freq: Every day | ORAL | 0 refills | Status: DC
Start: 2020-06-11 — End: 2020-08-19

## 2020-06-18 ENCOUNTER — Other Ambulatory Visit: Payer: Self-pay | Admitting: Family Medicine

## 2020-06-18 DIAGNOSIS — F419 Anxiety disorder, unspecified: Secondary | ICD-10-CM

## 2020-06-18 DIAGNOSIS — G4709 Other insomnia: Secondary | ICD-10-CM

## 2020-06-19 ENCOUNTER — Other Ambulatory Visit: Payer: Self-pay | Admitting: Family Medicine

## 2020-06-19 DIAGNOSIS — G4709 Other insomnia: Secondary | ICD-10-CM

## 2020-06-19 DIAGNOSIS — F419 Anxiety disorder, unspecified: Secondary | ICD-10-CM

## 2020-06-19 NOTE — Telephone Encounter (Signed)
No future visit last seen 3 months ago

## 2020-07-01 ENCOUNTER — Telehealth: Payer: 59 | Admitting: Physician Assistant

## 2020-07-01 DIAGNOSIS — B9689 Other specified bacterial agents as the cause of diseases classified elsewhere: Secondary | ICD-10-CM

## 2020-07-01 DIAGNOSIS — J019 Acute sinusitis, unspecified: Secondary | ICD-10-CM

## 2020-07-01 MED ORDER — AMOXICILLIN-POT CLAVULANATE 875-125 MG PO TABS
1.0000 | ORAL_TABLET | Freq: Two times a day (BID) | ORAL | 0 refills | Status: DC
Start: 2020-07-01 — End: 2020-07-29

## 2020-07-01 NOTE — Progress Notes (Signed)

## 2020-07-01 NOTE — Progress Notes (Signed)
I have spent 5 minutes in review of e-visit questionnaire, review and updating patient chart, medical decision making and response to patient.   Yancy Hascall Cody Jillann Charette, PA-C    

## 2020-07-09 ENCOUNTER — Telehealth: Payer: Self-pay | Admitting: Family Medicine

## 2020-07-09 DIAGNOSIS — R059 Cough, unspecified: Secondary | ICD-10-CM

## 2020-07-09 MED ORDER — PREDNISONE 10 MG (21) PO TBPK: ORAL_TABLET | ORAL | 0 refills | Status: DC

## 2020-07-09 MED ORDER — ALBUTEROL SULFATE HFA 108 (90 BASE) MCG/ACT IN AERS: 2.0000 | INHALATION_SPRAY | Freq: Four times a day (QID) | RESPIRATORY_TRACT | 2 refills | Status: DC | PRN

## 2020-07-09 NOTE — Telephone Encounter (Signed)
Pt states that she had Evisit and was prescribed an antibiotic. She still has a cough and hoarseness.She had a fever for about 2 weeks but does not at present time. She wonders if prednisone or an inhaler can be called into [facility]. She will come into office if you feel that is more appropriate

## 2020-07-09 NOTE — Telephone Encounter (Signed)
Advised patient as below. Medication was sent into the pharmacy.  

## 2020-07-09 NOTE — Telephone Encounter (Signed)
Albuterol MDI every 6 hours as needed.  Prednisone 10 mg 6-day taper.  #21

## 2020-07-09 NOTE — Telephone Encounter (Signed)
Please advise. Thanks.  

## 2020-07-23 ENCOUNTER — Ambulatory Visit: Payer: Self-pay | Admitting: *Deleted

## 2020-07-23 NOTE — Telephone Encounter (Signed)
  Reason for Disposition . Cough has been present for > 3 weeks  Protocols used: COUGH - ACUTE NON-PRODUCTIVE-A-AH

## 2020-07-23 NOTE — Telephone Encounter (Addendum)
       Pt reports various symptoms x 1 month. States started as sore throat, cough, fever, did Evisit, prescribed Amoxicillin, completed course. States mild cough and hoarseness remain.  Also spiked fever Monday night of 101.5. Pt states she feels better overall, but has runny nose and cough, productive for yellowish phlegm at times. Presently afebrile. Sates has done 2 home covid tests, negative. Pt is out of town. Questioning if another round of antibiotics could be called in for her. Advised probably would not be possible without being seen, advised UC. Pt declines, stating "But I feel a lot better, just hear people need 2 rounds of antibiotics for this." Also states "I don't think there is an UC here" ([facility]  Assured NT would route to practice for PCPs review and final disposition. Care advise given, verbalizes understanding.  Please advise  Answer Assessment - Initial Assessment Questions 1. ONSET: "When did the cough begin?"      1 month ago 2. SEVERITY: "How bad is the cough today?"     Mild to  oderate 3. SPUTUM: "Describe the color of your sputum" (none, dry cough; clear, white, yellow, green)    y ellow at times 4. HEMOPTYSIS: "Are you coughing up any blood?" If so ask: "How much?" (flecks, streaks, tablespoons, etc.)     *No Answer* 5. DIFFICULTY BREATHING: "Are you having difficulty breathing?" If Yes, ask: "How bad is it?" (e.g., mild, moderate, severe)    - MILD: No SOB at rest, mild SOB with walking, speaks normally in sentences, can lie down, no retractions, pulse < 100.    - MODERATE: SOB at rest, SOB with minimal exertion and prefers to sit, cannot lie down flat, speaks in phrases, mild retractions, audible wheezing, pulse 100-120.    - SEVERE: Very SOB at rest, speaks in single words, struggling to breathe, sitting hunched forward, retractions, pulse > 120      no 6. FEVER: "Do you have a fever?" If Yes, ask: "What is your temperature, how was it measured, and  when did it start?"     *Monday 101.5 7. CARDIAC HISTORY: "Do you have any history of heart disease?" (e.g., heart attack, congestive heart failure)      *No Answer* 8. LUNG HISTORY: "Do you have any history of lung disease?"  (e.g., pulmonary embolus, asthma, emphysema)     *No Answer* 9. PE RISK FACTORS: "Do you have a history of blood clots?" (or: recent major surgery, recent prolonged travel, bedridden)     *No Answer* 10. OTHER SYMPTOMS: "Do you have any other symptoms?" (e.g., runny nose, wheezing, chest pain)      Runny nose, hoarseness.  Protocols used: COUGH - ACUTE NON-PRODUCTIVE-A-AH

## 2020-07-23 NOTE — Telephone Encounter (Signed)
Please review. Patient is requesting another round of abx.

## 2020-07-29 ENCOUNTER — Encounter: Payer: Self-pay | Admitting: Emergency Medicine

## 2020-07-29 ENCOUNTER — Ambulatory Visit
Admission: EM | Admit: 2020-07-29 | Discharge: 2020-07-29 | Disposition: A | Discharge status: Home / Self Care | Payer: 59 | Attending: Emergency Medicine | Admitting: Emergency Medicine

## 2020-07-29 ENCOUNTER — Other Ambulatory Visit: Payer: Self-pay

## 2020-07-29 DIAGNOSIS — J0100 Acute maxillary sinusitis, unspecified: Secondary | ICD-10-CM

## 2020-07-29 MED ORDER — DOXYCYCLINE HYCLATE 100 MG PO CAPS: 100.0000 mg | ORAL_CAPSULE | Freq: Two times a day (BID) | ORAL | 0 refills | Status: DC

## 2020-07-29 NOTE — ED Provider Notes (Signed)
Roderic Palau    CSN: [id number] Arrival date & time: 07/29/20  1311      History   Chief Complaint Chief Complaint  Patient presents with  . Facial Pain  . Cough    HPI Kathy Barnes is a 60 y.o. female.   Patient presents with 6-week history of sinus pressure, congestion, postnasal drip, cough.  She states she had a fever last week but none since.  She denies rash, shortness of breath, wheezing, vomiting, diarrhea, or other symptoms.  Treatment at home with Mucinex, albuterol inhaler.  Patient had an ED visit on 07/01/2020; diagnosed with acute sinusitis; treated with Augmentin.  She was seen at an urgent care in [place] on 6-22; diagnosed with cough and URI; treated with Tessalon Perles.  Her medical history includes fibromyalgia, vertigo, GERD, anxiety, psoriasis, ADD.   The history is provided by the patient and medical records.    Past Medical History:  Diagnosis Date  . Abnormal uterine bleeding   . Anxiety    manage without medications.  . Fibromyalgia   . GERD (gastroesophageal reflux disease)   . History of uterine fibroid 2011  . Psoriasis   . Vertigo    last episode approx 12 yrs ago    Patient Active Problem List   Diagnosis Date Noted  . Special screening for malignant neoplasms, colon   . Benign neoplasm of sigmoid colon   . Heartburn   . Gastritis   . Anxiety 07/09/2014  . HLD (hyperlipidemia) 07/09/2014  . Cannot sleep 07/09/2014  . Allergic rhinitis, seasonal 07/09/2014  . GERD (gastroesophageal reflux disease) 05/03/2014  . 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 Fibromyalgia   . Psoriasis     Past Surgical History:  Procedure Laterality Date  . ANKLE SURGERY    . BIOPSY ENDOMETRIAL    . CHOLECYSTECTOMY    . COLONOSCOPY WITH PROPOFOL N/A 12/13/2014   Procedure: COLONOSCOPY WITH PROPOFOL;  Surgeon: Lucilla Lame, MD;  Location: [facility];  Service:  Endoscopy;  Laterality: N/A;  . DILATION AND CURETTAGE OF UTERUS    . ESOPHAGOGASTRODUODENOSCOPY (EGD) WITH PROPOFOL N/A 12/13/2014   Procedure: ESOPHAGOGASTRODUODENOSCOPY (EGD) WITH PROPOFOL;  Surgeon: Lucilla Lame, MD;  Location: [facility];  Service: Endoscopy;  Laterality: N/A;  . FOOT SURGERY Left   . HERNIA REPAIR    . POLYPECTOMY  12/13/2014   Procedure: POLYPECTOMY;  Surgeon: Lucilla Lame, MD;  Location: [facility];  Service: Endoscopy;;  . TONSILLECTOMY    . TUBAL LIGATION    . WISDOM TOOTH EXTRACTION     x4    OB History    Gravida  4   Para  3   Term  3   Preterm      AB  1   Living  3     SAB  1   IAB      Ectopic      Multiple      Live Births  3            Home Medications    Prior to Admission medications   Medication Sig Start Date End Date Taking? Authorizing Provider  albuterol (VENTOLIN HFA) 108 (90 Base) MCG/ACT inhaler Inhale 2 puffs into the lungs every 6 (six) hours as needed for wheezing or shortness of breath. 07/09/20  Yes Jerrol Banana., MD  doxycycline (VIBRAMYCIN) 100 MG capsule Take 1 capsule (100 mg total)  by mouth 2 (two) times daily. 07/29/20  Yes Sharion Balloon, NP  nortriptyline (PAMELOR) 10 MG capsule TAKE 1 CAPSULE (10 MG TOTAL) BY MOUTH 2 (TWO) TIMES DAILY. 06/19/20  Yes Jerrol Banana., MD  amphetamine-dextroamphetamine (ADDERALL) 10 MG tablet Take 1 tablet (10 mg total) by mouth daily. 03/14/20   Jerrol Banana., MD  amphetamine-dextroamphetamine (ADDERALL) 10 MG tablet Take 1 tablet (10 mg total) by mouth daily. 05/01/20   Jerrol Banana., MD  amphetamine-dextroamphetamine (ADDERALL) 10 MG tablet Take 1 tablet (10 mg total) by mouth daily. 06/11/20   Jerrol Banana., MD  MULTIPLE VITAMIN PO Take by mouth. 04/15/11   [provider]  predniSONE (STERAPRED UNI-PAK 21 TAB) 10 MG (21) TBPK tablet Taper as directed. 07/09/20   Jerrol Banana., MD    Family  History Family History  Problem Relation Age of Onset  . Hypertension Mother   . Arthritis Mother   . Hypertension Father   . Asthma Father   . Hyperlipidemia Father   . Diabetes Maternal Grandmother   . Cancer Maternal Grandmother        kidney  . Stroke Maternal Grandfather   . Diabetes Paternal Grandmother   . Heart disease Paternal Grandfather   . ADD / ADHD Son   . Hypertension Brother     Social History Social History   Tobacco Use  . Smoking status: Never Smoker  . Smokeless tobacco: Never Used  Substance Use Topics  . Alcohol use: No  . Drug use: No     Allergies   Ciprofloxacin   Review of Systems Review of Systems  Constitutional: Negative for chills and fever.  HENT: Positive for congestion, postnasal drip and sinus pressure. Negative for ear pain and sore throat.   Respiratory: Positive for cough. Negative for shortness of breath.   Cardiovascular: Negative for chest pain and palpitations.  Gastrointestinal: Negative for abdominal pain, diarrhea and vomiting.  Skin: Negative for color change and rash.  All other systems reviewed and are negative.    Physical Exam Triage Vital Signs ED Triage Vitals  Enc Vitals Group     BP 07/29/20 1329 (!) 135/93     Pulse Rate 07/29/20 1329 (!) 102     Resp 07/29/20 1329 15     Temp 07/29/20 1329 99.7 F (37.6 C)     Temp Source 07/29/20 1329 Oral     SpO2 07/29/20 1329 96 %     Weight --      Height --      Head Circumference --      Peak Flow --      Pain Score 07/29/20 1326 5     Pain Loc --      Pain Edu? --      Excl. in [place]? --    No data found.  Updated Vital Signs BP (!) 135/93 (BP Location: Left Arm)   Pulse (!) 102   Temp 99.7 F (37.6 C) (Oral)   Resp 15   LMP 07/23/2009   SpO2 96%   Visual Acuity Right Eye Distance:   Left Eye Distance:   Bilateral Distance:    Right Eye Near:   Left Eye Near:    Bilateral Near:     Physical Exam Vitals and nursing note reviewed.   Constitutional:      General: She is not in acute distress.    Appearance: She is well-developed. She is not ill-appearing.  HENT:     Head: Normocephalic and atraumatic.     Right Ear: Tympanic membrane normal.     Left Ear: Tympanic membrane normal.     Nose: Congestion present.     Mouth/Throat:     Mouth: Mucous membranes are moist.     Pharynx: Oropharynx is clear.  Eyes:     Conjunctiva/sclera: Conjunctivae normal.  Cardiovascular:     Rate and Rhythm: Normal rate and regular rhythm.     Heart sounds: Normal heart sounds.  Pulmonary:     Effort: Pulmonary effort is normal. No respiratory distress.     Breath sounds: Normal breath sounds.  Abdominal:     Palpations: Abdomen is soft.     Tenderness: There is no abdominal tenderness.  Musculoskeletal:     Cervical back: Neck supple.  Skin:    General: Skin is warm and dry.  Neurological:     General: No focal deficit present.     Mental Status: She is alert and oriented to person, place, and time.     Gait: Gait normal.  Psychiatric:        Mood and Affect: Mood normal.        Behavior: Behavior normal.      UC Treatments / Results  Labs (all labs ordered are listed, but only abnormal results are displayed) Labs Reviewed - No data to display  EKG   Radiology No results found.  Procedures Procedures (including critical care time)  Medications Ordered in UC Medications - No data to display  Initial Impression / Assessment and Plan / UC Course  I have reviewed the triage vital signs and the nursing notes.  Pertinent labs & imaging results that were available during my care of the patient were reviewed by me and considered in my medical decision making (see chart for details).   Acute sinusitis.  Patient was treated with Augmentin 1 month ago.  Her symptoms persist.  Treating with doxycycline x10 days.  Instructed her to follow-up with her PCP or an ENT if her symptoms are not improving.  She agrees to plan  of care.   Final Clinical Impressions(s) / UC Diagnoses   Final diagnoses:  Acute non-recurrent maxillary sinusitis     Discharge Instructions     Take the doxycycline as directed.    Follow up with your primary care provider or ENT if your symptoms are not improving.       ED Prescriptions    Medication Sig Dispense Auth. Provider   doxycycline (VIBRAMYCIN) 100 MG capsule Take 1 capsule (100 mg total) by mouth 2 (two) times daily. 20 capsule Sharion Balloon, NP     PDMP not reviewed this encounter.   Sharion Balloon, NP 07/29/20 1400

## 2020-07-29 NOTE — ED Triage Notes (Signed)
Patient c/o sinus pressure and productive cough w "unkown" sputum color x 6 weeks.   Patient endorses onset of symptoms began with sore throat , runny nose, and cough.   Patient states " every week I feel like I'm better and then a fever will pop up".   Patient endorses a temperature 101.3 F last week.   Patient denies Chest Pain.   Patient endorses SOB at times.   Patient used an Albuterol inhaler, Mucinex, and Amoxicillin upon onset of symptoms, Prednisone with no relief of symptoms.

## 2020-07-29 NOTE — Discharge Instructions (Addendum)
Take the doxycycline as directed.    Follow up with your primary care provider or ENT if your symptoms are not improving.

## 2020-08-19 ENCOUNTER — Other Ambulatory Visit: Payer: Self-pay | Admitting: Family Medicine

## 2020-08-19 DIAGNOSIS — G4709 Other insomnia: Secondary | ICD-10-CM

## 2020-08-19 DIAGNOSIS — F988 Other specified behavioral and emotional disorders with onset usually occurring in childhood and adolescence: Secondary | ICD-10-CM

## 2020-08-19 DIAGNOSIS — F419 Anxiety disorder, unspecified: Secondary | ICD-10-CM

## 2020-08-19 MED ORDER — NORTRIPTYLINE HCL 10 MG PO CAPS: ORAL_CAPSULE | ORAL | 0 refills | Status: DC

## 2020-08-19 NOTE — Telephone Encounter (Signed)
Medication Refill - Medication: nortriptyline (PAMELOR) 10 MG capsule  amphetamine-dextroamphetamine (ADDERALL) 10 MG tablet  Has the patient contacted their pharmacy? Yes  (Agent: If no, request that the patient contact the pharmacy for the refill.) (Agent: If yes, when and what did the pharmacy advise?) no refills /   Preferred Pharmacy (with phone number or street name): [id number] [address], [place] - [place]  [place], [place] [address]  Phone:  [phone]  Fax:  [phone]   Agent: Please be advised that RX refills may take up to 3 business days. We ask that you follow-up with your pharmacy.

## 2020-08-19 NOTE — Telephone Encounter (Signed)
Requested medication (s) are due for refill today: yes  Requested medication (s) are on the active medication list: yes    Future visit scheduled: no  Notes to clinic: this refill cannot be delegated  Due for 3 month follow up    Requested Prescriptions  Pending Prescriptions Disp Refills   amphetamine-dextroamphetamine (ADDERALL) 10 MG tablet 30 tablet 0    Sig: Take 1 tablet (10 mg total) by mouth daily.      Not Delegated - Psychiatry:  Stimulants/ADHD Failed - 08/19/2020 11:28 AM      Failed - This refill cannot be delegated      Failed - Urine Drug Screen completed in last 360 days      Failed - Valid encounter within last 3 months    Recent Outpatient Visits           5 months ago [place] Jerrol Banana., MD   1 year ago Viral upper respiratory tract infection with cough   [facility] Flinchum, Kelby Aline, FNP   1 year ago [place] Jerrol Banana., MD   2 years ago Gastroesophageal reflux disease without esophagitis   [facility] Jerrol Banana., MD   2 years ago [place] Jerrol Banana., MD                 Signed Prescriptions Disp Refills   nortriptyline (PAMELOR) 10 MG capsule 90 capsule 0    Sig: TAKE 1 CAPSULE (10 MG TOTAL) BY MOUTH 2 (TWO) TIMES DAILY.      Psychiatry:  Antidepressants - Heterocyclics (TCAs) Passed - 08/19/2020 11:28 AM      Passed - Valid encounter within last 6 months    Recent Outpatient Visits           5 months ago [place] Jerrol Banana., MD   1 year ago Viral upper respiratory tract infection with cough   [facility] Flinchum, Kelby Aline, FNP   1 year ago [place] Jerrol Banana., MD   2 years ago Gastroesophageal reflux disease without esophagitis   [facility]  Jerrol Banana., MD   2 years ago [place] Jerrol Banana., MD

## 2020-08-19 NOTE — Telephone Encounter (Signed)
Pt called and scheduled next available appt for med refill, she is requesting a refill to last her until scheduled time.  [id number] [address], [place] [address]  Phone: [phone] Fax: [phone]    amphetamine-dextroamphetamine (ADDERALL) 10 MG tablet

## 2020-08-19 NOTE — Telephone Encounter (Signed)
  Notes to clinic:  Patient scheduled for 09/11/2020 Review for 30 day supply    Requested Prescriptions  Pending Prescriptions Disp Refills   amphetamine-dextroamphetamine (ADDERALL) 10 MG tablet 30 tablet 0    Sig: Take 1 tablet (10 mg total) by mouth daily.      Not Delegated - Psychiatry:  Stimulants/ADHD Failed - 08/19/2020 11:41 AM      Failed - This refill cannot be delegated      Failed - Urine Drug Screen completed in last 360 days      Failed - Valid encounter within last 3 months    Recent Outpatient Visits           5 months ago [place] Jerrol Banana., MD   1 year ago Viral upper respiratory tract infection with cough   [facility] Flinchum, Kelby Aline, FNP   1 year ago [place] Jerrol Banana., MD   2 years ago Gastroesophageal reflux disease without esophagitis   [facility] Jerrol Banana., MD   2 years ago [place] Jerrol Banana., MD       Future Appointments             In 3 weeks Jerrol Banana., MD [facility]              Signed Prescriptions Disp Refills   nortriptyline (PAMELOR) 10 MG capsule 90 capsule 0    Sig: TAKE 1 CAPSULE (10 MG TOTAL) BY MOUTH 2 (TWO) TIMES DAILY.      Psychiatry:  Antidepressants - Heterocyclics (TCAs) Passed - 08/19/2020 11:28 AM      Passed - Valid encounter within last 6 months    Recent Outpatient Visits           5 months ago [place] Jerrol Banana., MD   1 year ago Viral upper respiratory tract infection with cough   [facility] Flinchum, Kelby Aline, FNP   1 year ago [place] Jerrol Banana., MD   2 years ago Gastroesophageal reflux disease without esophagitis   [facility] Jerrol Banana., MD   2 years ago  [place] Jerrol Banana., MD       Future Appointments             In 3 weeks Jerrol Banana., MD [facility], [place]

## 2020-08-22 MED ORDER — AMPHETAMINE-DEXTROAMPHETAMINE 10 MG PO TABS: 10.0000 mg | ORAL_TABLET | Freq: Every day | ORAL | 0 refills | Status: DC

## 2020-09-04 ENCOUNTER — Telehealth: Payer: Self-pay

## 2020-09-04 NOTE — Telephone Encounter (Signed)
Copied from [place] [phone]. Topic: General - Other >> Sep 04, 2020  2:52 PM Tessa Lerner A wrote: Reason for CRM: Patient has called to share that they have been experiencing digestive discomfort for roughly a week   Patient shares that they have previously been prescribed omeprazole (PRILOSEC) 40 MG capsule   The patient would like to be re-prescribed the medication if possible   Please contact to further advise

## 2020-09-05 ENCOUNTER — Other Ambulatory Visit: Payer: Self-pay | Admitting: Family Medicine

## 2020-09-05 MED ORDER — OMEPRAZOLE 40 MG PO CPDR: 40.0000 mg | DELAYED_RELEASE_CAPSULE | ORAL | 2 refills | Status: DC

## 2020-09-05 NOTE — Telephone Encounter (Signed)
Rx was sent to pharmacy. Patient was advised.

## 2020-09-05 NOTE — Telephone Encounter (Signed)
Requested medication (s) are due for refill today: see encounter  Requested medication (s) are on the active medication list: yes   Last refill:  09/05/20 #30 2 refills  Future visit scheduled: yes in 6 days   Notes to clinic:  see encounter.Pharmacy comment: Alternative Requested:PRIOR AUTH REQUIRED.     Requested Prescriptions  Pending Prescriptions Disp Refills   omeprazole (PRILOSEC) 40 MG capsule [Pharmacy Med Name: OMEPRAZOLE DR 40 MG CAPSULE] 30 capsule 2    Sig: Take 1 capsule (40 mg total) by mouth every morning.      Gastroenterology: Proton Pump Inhibitors Passed - 09/05/2020  5:15 PM      Passed - Valid encounter within last 12 months    Recent Outpatient Visits           5 months ago [place] Jerrol Banana., MD   1 year ago Viral upper respiratory tract infection with cough   [facility], Kelby Aline, FNP   1 year ago [place] Jerrol Banana., MD   2 years ago Gastroesophageal reflux disease without esophagitis   [facility] Jerrol Banana., MD   2 years ago [place] Jerrol Banana., MD       Future Appointments             In 6 days Jerrol Banana., MD [facility], [place]

## 2020-09-11 ENCOUNTER — Telehealth (INDEPENDENT_AMBULATORY_CARE_PROVIDER_SITE_OTHER): Payer: 59 | Admitting: Family Medicine

## 2020-09-11 DIAGNOSIS — R059 Cough, unspecified: Secondary | ICD-10-CM

## 2020-09-11 DIAGNOSIS — M797 Fibromyalgia: Secondary | ICD-10-CM

## 2020-09-11 DIAGNOSIS — F988 Other specified behavioral and emotional disorders with onset usually occurring in childhood and adolescence: Secondary | ICD-10-CM

## 2020-09-11 MED ORDER — AMPHETAMINE-DEXTROAMPHETAMINE 10 MG PO TABS: 10.0000 mg | ORAL_TABLET | Freq: Every day | ORAL | 0 refills | Status: DC

## 2020-09-11 NOTE — Progress Notes (Incomplete Revision)
MyChart Video Visit    Virtual Visit via Video Note   This visit type was conducted due to national recommendations for restrictions regarding the COVID-19 Pandemic (e.g. social distancing) in an effort to limit this patient's exposure and mitigate transmission in our community. This patient is at least at moderate risk for complications without adequate follow up. This format is felt to be most appropriate for this patient at this time. Physical exam was limited by quality of the video and audio technology used for the visit.   Patient location: Home Provider location: Office  I discussed the limitations of evaluation and management by telemedicine and the availability of in person appointments. The patient expressed understanding and agreed to proceed. This is a video visit.Telemed visit. Patient: Kathy Barnes   DOB: [birth year]   60 y.o. Female  MRN: [id number] Visit Date: 09/11/2020  Today's healthcare provider: Wilhemena Durie, MD   No chief complaint on file.  Subjective    HPI  Patient calls in today for follow-up after having been sick for 2-1/2 months.  She had cough and fatigue.  She had a home negative test twice for COVID.  She never had a PCR test. She is back to normal now and is ready to restart her Adderall.  She does need regular omeprazole for reflux.  She has stopped her Pamelor for fibromyalgia and is doing fine. Follow up for Fibromyalgia   The patient was last seen for this 6 months ago. Changes made at last visit include no medication changes.  She reports good compliance with treatment. She feels that condition is Unchanged. She is not having side effects.   Follow up for ADD  The patient was last seen for this 6 months ago. Changes made at last visit include no medication changes.  She reports good compliance with treatment. She feels that condition is Unchanged. She is not having side effects.    -----------------------------------------------------------------------------------------      Medications: Outpatient Medications Prior to Visit  Medication Sig   albuterol (VENTOLIN HFA) 108 (90 Base) MCG/ACT inhaler Inhale 2 puffs into the lungs every 6 (six) hours as needed for wheezing or shortness of breath.   amphetamine-dextroamphetamine (ADDERALL) 10 MG tablet Take 1 tablet (10 mg total) by mouth daily.   amphetamine-dextroamphetamine (ADDERALL) 10 MG tablet Take 1 tablet (10 mg total) by mouth daily.   amphetamine-dextroamphetamine (ADDERALL) 10 MG tablet Take 1 tablet (10 mg total) by mouth daily.   doxycycline (VIBRAMYCIN) 100 MG capsule Take 1 capsule (100 mg total) by mouth 2 (two) times daily.   MULTIPLE VITAMIN PO Take by mouth.   nortriptyline (PAMELOR) 10 MG capsule TAKE 1 CAPSULE (10 MG TOTAL) BY MOUTH 2 (TWO) TIMES DAILY.   omeprazole (PRILOSEC) 40 MG capsule Take 1 capsule (40 mg total) by mouth every morning.   predniSONE (STERAPRED UNI-PAK 21 TAB) 10 MG (21) TBPK tablet Taper as directed.   No facility-administered medications prior to visit.    Review of Systems  Constitutional:  Negative for activity change and fatigue.  Respiratory:  Negative for cough and shortness of breath.   Cardiovascular:  Negative for chest pain, palpitations and leg swelling.  Musculoskeletal:  Negative for arthralgias, joint swelling and myalgias.  Neurological:  Negative for dizziness, facial asymmetry and headaches.  Psychiatric/Behavioral:  Negative for agitation, decreased concentration, self-injury, sleep disturbance and suicidal ideas. The patient is not nervous/anxious and is not hyperactive.       Objective  LMP 07/23/2009     Physical Exam  She is alert and appropriate on the interview.  She is having no breathing difficulties and is able to complete sentences easily.  She has no cough during the entire interview.   Assessment & Plan     1.  Cough Presumptive COVID although she tested negative.  She is improved and is much better, basically resolved.  No dyspnea at all so no chest x-ray needed.  2. Attention deficit disorder (ADD) without hyperactivity   3. Fibromyalgia Off of Pamelor and doing well  4. ADD (attention deficit disorder) Refill Adderall and see her back when she needs refills - amphetamine-dextroamphetamine (ADDERALL) 10 MG tablet; Take 1 tablet (10 mg total) by mouth daily.  Dispense: 30 tablet; Refill: 0 - amphetamine-dextroamphetamine (ADDERALL) 10 MG tablet; Take 1 tablet (10 mg total) by mouth daily.  Dispense: 30 tablet; Refill: 0 - amphetamine-dextroamphetamine (ADDERALL) 10 MG tablet; Take 1 tablet (10 mg total) by mouth daily.  Dispense: 30 tablet; Refill: 0   No follow-ups on file.     I discussed the assessment and treatment plan with the patient. The patient was provided an opportunity to ask questions and all were answered. The patient agreed with the plan and demonstrated an understanding of the instructions.   The patient was advised to call back or seek an in-person evaluation if the symptoms worsen or if the condition fails to improve as anticipated.  I provided 12 minutes of non-face-to-face time during this encounter.  I, Wilhemena Durie, MD, have reviewed all documentation for this visit. The documentation on 09/12/20 for the exam, diagnosis, procedures, and orders are all accurate and complete.   Xaivier Malay Cranford Mon, MD [facility] [phone] (phone) [phone] (fax)  [place]

## 2020-09-11 NOTE — Progress Notes (Addendum)
MyChart Video Visit    Virtual Visit via Video Note   This visit type was conducted due to national recommendations for restrictions regarding the COVID-19 Pandemic (e.g. social distancing) in an effort to limit this patient's exposure and mitigate transmission in our community. This patient is at least at moderate risk for complications without adequate follow up. This format is felt to be most appropriate for this patient at this time. Physical exam was limited by quality of the video and audio technology used for the visit.   Patient location: Home Provider location: Office  I discussed the limitations of evaluation and management by telemedicine and the availability of in person appointments. The patient expressed understanding and agreed to proceed. This is a video visit.Telemed visit. Patient: Kathy Barnes   DOB: 03/25/1960   60 y.o. Female  MRN: [id number] Visit Date: 09/11/2020  Today's healthcare provider: Wilhemena Durie, MD   No chief complaint on file.  Subjective    HPI  Patient calls in today for follow-up after having been sick for 2-1/2 months.  She had cough and fatigue.  She had a home negative test twice for COVID.  She never had a PCR test. She is back to normal now and is ready to restart her Adderall.  She does need regular omeprazole for reflux.  She has stopped her Pamelor for fibromyalgia and is doing fine. Follow up for Fibromyalgia   The patient was last seen for this 6 months ago. Changes made at last visit include no medication changes.  She reports good compliance with treatment. She feels that condition is Unchanged. She is not having side effects.   Follow up for ADD  The patient was last seen for this 6 months ago. Changes made at last visit include no medication changes.  She reports good compliance with treatment. She feels that condition is Unchanged. She is not having side effects.    -----------------------------------------------------------------------------------------      Medications: Outpatient Medications Prior to Visit  Medication Sig  . albuterol (VENTOLIN HFA) 108 (90 Base) MCG/ACT inhaler Inhale 2 puffs into the lungs every 6 (six) hours as needed for wheezing or shortness of breath.  . amphetamine-dextroamphetamine (ADDERALL) 10 MG tablet Take 1 tablet (10 mg total) by mouth daily.  Marland Kitchen amphetamine-dextroamphetamine (ADDERALL) 10 MG tablet Take 1 tablet (10 mg total) by mouth daily.  Marland Kitchen amphetamine-dextroamphetamine (ADDERALL) 10 MG tablet Take 1 tablet (10 mg total) by mouth daily.  Marland Kitchen doxycycline (VIBRAMYCIN) 100 MG capsule Take 1 capsule (100 mg total) by mouth 2 (two) times daily.  . MULTIPLE VITAMIN PO Take by mouth.  . nortriptyline (PAMELOR) 10 MG capsule TAKE 1 CAPSULE (10 MG TOTAL) BY MOUTH 2 (TWO) TIMES DAILY.  Marland Kitchen omeprazole (PRILOSEC) 40 MG capsule Take 1 capsule (40 mg total) by mouth every morning.  . predniSONE (STERAPRED UNI-PAK 21 TAB) 10 MG (21) TBPK tablet Taper as directed.   No facility-administered medications prior to visit.    Review of Systems  Constitutional:  Negative for activity change and fatigue.  Respiratory:  Negative for cough and shortness of breath.   Cardiovascular:  Negative for chest pain, palpitations and leg swelling.  Musculoskeletal:  Negative for arthralgias, joint swelling and myalgias.  Neurological:  Negative for dizziness, facial asymmetry and headaches.  Psychiatric/Behavioral:  Negative for agitation, decreased concentration, self-injury, sleep disturbance and suicidal ideas. The patient is not nervous/anxious and is not hyperactive.       Objective  LMP 07/23/2009     Physical Exam  She is alert and appropriate on the interview.  She is having no breathing difficulties and is able to complete sentences easily.  She has no cough during the entire interview.   Assessment & Plan     1.  Cough Presumptive COVID although she tested negative.  She is improved and is much better, basically resolved.  No dyspnea at all so no chest x-ray needed.  2. Attention deficit disorder (ADD) without hyperactivity   3. Fibromyalgia Off of Pamelor and doing well  4. ADD (attention deficit disorder) Refill Adderall and see her back when she needs refills - amphetamine-dextroamphetamine (ADDERALL) 10 MG tablet; Take 1 tablet (10 mg total) by mouth daily.  Dispense: 30 tablet; Refill: 0 - amphetamine-dextroamphetamine (ADDERALL) 10 MG tablet; Take 1 tablet (10 mg total) by mouth daily.  Dispense: 30 tablet; Refill: 0 - amphetamine-dextroamphetamine (ADDERALL) 10 MG tablet; Take 1 tablet (10 mg total) by mouth daily.  Dispense: 30 tablet; Refill: 0   No follow-ups on file.     I discussed the assessment and treatment plan with the patient. The patient was provided an opportunity to ask questions and all were answered. The patient agreed with the plan and demonstrated an understanding of the instructions.   The patient was advised to call back or seek an in-person evaluation if the symptoms worsen or if the condition fails to improve as anticipated.  I provided 12 minutes of non-face-to-face time during this encounter.  I, Wilhemena Durie, MD, have reviewed all documentation for this visit. The documentation on 09/12/20 for the exam, diagnosis, procedures, and orders are all accurate and complete.   Richard Cranford Mon, MD [facility] [phone] (phone) [phone] (fax)  [place]

## 2020-09-12 ENCOUNTER — Other Ambulatory Visit: Payer: Self-pay | Admitting: Family Medicine

## 2020-09-12 DIAGNOSIS — K219 Gastro-esophageal reflux disease without esophagitis: Secondary | ICD-10-CM

## 2020-09-12 NOTE — Telephone Encounter (Signed)
   Notes to clinic:  Alternative Requested:DRUG IS NOT COVERED BY INS. PLEASE ADVISE   Requested Prescriptions  Pending Prescriptions Disp Refills   omeprazole (PRILOSEC) 40 MG capsule [Pharmacy Med Name: OMEPRAZOLE DR 40 MG CAPSULE] 90 capsule 3    Sig: TAKE 1 CAPSULE (40 MG TOTAL) BY MOUTH EVERY MORNING. (NOT COVERED BY INS)      Gastroenterology: Proton Pump Inhibitors Passed - 09/12/2020  1:32 PM      Passed - Valid encounter within last 12 months    Recent Outpatient Visits           Yesterday Cough   [facility] Jerrol Banana., MD   6 months ago [place] Jerrol Banana., MD   1 year ago Viral upper respiratory tract infection with cough   [facility] Flinchum, Kelby Aline, FNP   1 year ago [place] Jerrol Banana., MD   2 years ago Gastroesophageal reflux disease without esophagitis   [facility] Jerrol Banana., MD

## 2020-09-12 NOTE — Telephone Encounter (Signed)
Please advise? Patient is requesting an alternative for omeprazole due to insurance coverage.

## 2020-09-12 NOTE — Telephone Encounter (Signed)
   Notes to clinic:  Alternative Requested:NOT COVERED BY INS.   Requested Prescriptions  Pending Prescriptions Disp Refills   omeprazole (PRILOSEC) 40 MG capsule [Pharmacy Med Name: OMEPRAZOLE DR 40 MG CAPSULE] 30 capsule 2    Sig: TAKE 1 CAPSULE (40 MG TOTAL) BY MOUTH EVERY MORNING.      Gastroenterology: Proton Pump Inhibitors Passed - 09/12/2020  9:49 AM      Passed - Valid encounter within last 12 months    Recent Outpatient Visits           Yesterday Cough   [facility] Jerrol Banana., MD   6 months ago [place] Jerrol Banana., MD   1 year ago Viral upper respiratory tract infection with cough   [facility] Flinchum, Kelby Aline, FNP   1 year ago [place] Jerrol Banana., MD   2 years ago Gastroesophageal reflux disease without esophagitis   [facility] Jerrol Banana., MD

## 2020-09-18 ENCOUNTER — Other Ambulatory Visit: Payer: Self-pay | Admitting: Family Medicine

## 2020-09-18 DIAGNOSIS — N3000 Acute cystitis without hematuria: Secondary | ICD-10-CM | POA: Diagnosis not present

## 2020-09-18 DIAGNOSIS — G4709 Other insomnia: Secondary | ICD-10-CM

## 2020-09-18 DIAGNOSIS — F419 Anxiety disorder, unspecified: Secondary | ICD-10-CM

## 2020-09-18 NOTE — Telephone Encounter (Signed)
Courtesy refill. Patient needs an office visit for further refills. Requested Prescriptions  Pending Prescriptions Disp Refills  . nortriptyline (PAMELOR) 10 MG capsule [Pharmacy Med Name: NORTRIPTYLINE HCL 10 MG CAP] 60 capsule 0    Sig: TAKE 1 CAPSULE BY MOUTH 2 TIMES DAILY.     Psychiatry:  Antidepressants - Heterocyclics (TCAs) Passed - 09/18/2020  3:32 AM      Passed - Valid encounter within last 6 months    Recent Outpatient Visits          1 week ago Cough   [facility] Jerrol Banana., MD   6 months ago [place] Jerrol Banana., MD   1 year ago Viral upper respiratory tract infection with cough   [facility] Flinchum, Kelby Aline, FNP   1 year ago [place] Jerrol Banana., MD   2 years ago Gastroesophageal reflux disease without esophagitis   [facility] Jerrol Banana., MD

## 2020-11-27 DIAGNOSIS — N390 Urinary tract infection, site not specified: Secondary | ICD-10-CM | POA: Diagnosis not present

## 2020-11-27 DIAGNOSIS — R300 Dysuria: Secondary | ICD-10-CM | POA: Diagnosis not present

## 2020-11-27 DIAGNOSIS — N952 Postmenopausal atrophic vaginitis: Secondary | ICD-10-CM | POA: Diagnosis not present

## 2021-01-13 ENCOUNTER — Telehealth: Payer: Self-pay

## 2021-01-13 NOTE — Telephone Encounter (Signed)
Copied from [place] [id number]. Topic: Appointment Scheduling - Scheduling Inquiry for Clinic >> Jan 12, 2021  3:47 PM Pawlus, Brayton Layman A wrote: Reason for CRM: Pt wanted to be worked in for an office visit, pt stated no to having COVID in the last 10 days, pt has congestion / head cold,  please advise.

## 2021-01-13 NOTE — Telephone Encounter (Signed)
Please advise 

## 2021-01-13 NOTE — Telephone Encounter (Signed)
LMOVM for pt to return call. Okay for pec to advise patient.

## 2021-02-12 ENCOUNTER — Encounter: Payer: Self-pay | Admitting: Physician Assistant

## 2021-02-12 ENCOUNTER — Other Ambulatory Visit: Payer: Self-pay

## 2021-02-12 ENCOUNTER — Ambulatory Visit (INDEPENDENT_AMBULATORY_CARE_PROVIDER_SITE_OTHER): Payer: BC Managed Care – PPO | Admitting: Physician Assistant

## 2021-02-12 VITALS — BP 119/79 | HR 84 | Temp 98.6°F | Ht 62.0 in | Wt 146.5 lb

## 2021-02-12 DIAGNOSIS — R0981 Nasal congestion: Secondary | ICD-10-CM | POA: Insufficient documentation

## 2021-02-12 DIAGNOSIS — L659 Nonscarring hair loss, unspecified: Secondary | ICD-10-CM

## 2021-02-12 DIAGNOSIS — E785 Hyperlipidemia, unspecified: Secondary | ICD-10-CM

## 2021-02-12 MED ORDER — AZELASTINE HCL 0.1 % NA SOLN: 2.0000 | Freq: Two times a day (BID) | NASAL | 12 refills | Status: DC

## 2021-02-12 MED ORDER — FLUTICASONE PROPIONATE 50 MCG/ACT NA SUSP
2.0000 | Freq: Every day | NASAL | 6 refills | Status: DC
Start: 2021-02-12 — End: 2021-04-14

## 2021-02-12 NOTE — Assessment & Plan Note (Addendum)
May be allergic in nature.  Rx azelastine Flonase instructed to alternate 1 in a.m. and 1 PM for at least 2 weeks to see if this brings benefit.  Advise she start taking antihistamine over-the-counter, Zyrtec, Claritin, Allegra, Xyzal.  Of her choice.  Advise she try this for at least a month, if her symptoms still persist we can try Singulair.  At this time I do not believe she has a upper respiratory infection, but due to the recent exposure to others advised her to test for COVID.  I also advise she try taking Tums to see if this relieves any of her symptoms to her history of acid reflux and constant postnasal drip.

## 2021-02-12 NOTE — Assessment & Plan Note (Signed)
Historically we will check lipids today.  Patient did have a bowl of cereal this morning, comfortable still getting labs.

## 2021-02-12 NOTE — Progress Notes (Signed)
Established patient visit   Patient: Kathy Barnes   DOB: [birth year]   60 y.o. Female  MRN: [id number] Visit Date: 02/12/2021  Today's healthcare provider: Mikey Kirschner, PA-C   Cc. Nasal congestion, sore throat, PND x 7 weeks  Subjective    HPI   Kathy Barnes is a 60 y/o female who presents today with concerns of a sore throat, hoarseness, nasal congestion, postnasal drip, left ear fullness and tender throat glands for the last 7 weeks on and off.  She reports she had a viral illness in the spring which did completely resolve but then her symptoms returned at a low level and and are now exacerbated.  She denies any fever/chills/body aches.  Her husband at home is sick and so are a few other people she was around a few days ago.  Denies anyone COVID testing recently.  She reports taking Sudafed at home helps relieve her symptoms.  She also has a history of acid reflux, not consistently taking medication as she feels it is under control.  She reports in the spring when the symptoms first appeared trying acid for acid reflux medication and it did not help her symptoms.  She also reports recent hair thinning that is out of the ordinary for her.  Reports it is difficult to lose weight, denies any unusual weight gain, excessive fatigue. Medications: Outpatient Medications Prior to Visit  Medication Sig   amphetamine-dextroamphetamine (ADDERALL) 10 MG tablet Take 1 tablet (10 mg total) by mouth daily.   MULTIPLE VITAMIN PO Take by mouth.   [DISCONTINUED] albuterol (VENTOLIN HFA) 108 (90 Base) MCG/ACT inhaler Inhale 2 puffs into the lungs every 6 (six) hours as needed for wheezing or shortness of breath. (Patient not taking: Reported on 02/12/2021)   [DISCONTINUED] amphetamine-dextroamphetamine (ADDERALL) 10 MG tablet Take 1 tablet (10 mg total) by mouth daily. (Patient not taking: Reported on 02/12/2021)   [DISCONTINUED] amphetamine-dextroamphetamine (ADDERALL) 10 MG tablet Take 1 tablet (10 mg  total) by mouth daily. (Patient not taking: Reported on 02/12/2021)   [DISCONTINUED] doxycycline (VIBRAMYCIN) 100 MG capsule Take 1 capsule (100 mg total) by mouth 2 (two) times daily. (Patient not taking: Reported on 02/12/2021)   [DISCONTINUED] famotidine (PEPCID) 20 MG tablet Take 1 tablet (20 mg total) by mouth 2 (two) times daily. (Patient not taking: Reported on 02/12/2021)   [DISCONTINUED] nortriptyline (PAMELOR) 10 MG capsule TAKE 1 CAPSULE BY MOUTH 2 TIMES DAILY.   [DISCONTINUED] predniSONE (STERAPRED UNI-PAK 21 TAB) 10 MG (21) TBPK tablet Taper as directed. (Patient not taking: Reported on 02/12/2021)   No facility-administered medications prior to visit.    Review of Systems  Constitutional:  Negative for fatigue and fever.  HENT:  Positive for congestion, ear pain, postnasal drip, rhinorrhea, sinus pain and sore throat.   Respiratory:  Positive for cough. Negative for shortness of breath and wheezing.   Cardiovascular:  Negative for chest pain.   Last CBC Lab Results  Component Value Date   WBC 8.4 08/16/2018   HGB 14.8 08/16/2018   HCT 42.5 08/16/2018   MCV 94 08/16/2018   MCH 32.6 08/16/2018   RDW 11.9 08/16/2018   PLT 245 78/24/2353   Last metabolic panel Lab Results  Component Value Date   GLUCOSE 82 08/16/2018   NA 141 08/16/2018   K 4.6 08/16/2018   CL 104 08/16/2018   CO2 23 08/16/2018   BUN 13 08/16/2018   CREATININE 0.87 08/16/2018   GFRNONAA 74 08/16/2018   CALCIUM  9.3 08/16/2018   PROT 6.5 08/16/2018   ALBUMIN 4.4 08/16/2018   LABGLOB 2.1 08/16/2018   AGRATIO 2.1 08/16/2018   BILITOT 0.2 08/16/2018   ALKPHOS 62 08/16/2018   AST 15 08/16/2018   ALT 10 08/16/2018   Last lipids Lab Results  Component Value Date   CHOL 212 (H) 08/16/2018   HDL 59 08/16/2018   LDLCALC 139 (H) 08/16/2018   TRIG 70 08/16/2018   CHOLHDL 3.6 08/16/2018   Last hemoglobin A1c Lab Results  Component Value Date   HGBA1C 5.2 08/16/2018   Last thyroid  functions Lab Results  Component Value Date   TSH 2.010 08/16/2018   Last vitamin D No results found for: 25OHVITD2, 25OHVITD3, VD25OH Last vitamin B12 and Folate Lab Results  Component Value Date   VITAMINB12 510 08/16/2018       Objective    BP 119/79 (BP Location: Right Arm, Patient Position: Sitting, Cuff Size: Normal)    Pulse 84    Temp 98.6 F (37 C) (Oral)    Ht 5\' 2"  (1.575 m)    Wt 146 lb 8 oz (66.5 kg)    LMP 07/23/2009    SpO2 98%    BMI 26.80 kg/m  BP Readings from Last 3 Encounters:  02/12/21 119/79  07/29/20 (!) 135/93  08/16/18 125/87   Wt Readings from Last 3 Encounters:  02/12/21 146 lb 8 oz (66.5 kg)  08/16/18 135 lb 9.6 oz (61.5 kg)  04/20/18 141 lb (64 kg)      Physical Exam Constitutional:      General: She is awake.     Appearance: She is well-developed.  HENT:     Head: Normocephalic.     Right Ear: Tympanic membrane and ear canal normal.     Left Ear: Tympanic membrane and ear canal normal.     Nose: Congestion and rhinorrhea present.     Mouth/Throat:     Mouth: Mucous membranes are moist.     Pharynx: Posterior oropharyngeal erythema present. No oropharyngeal exudate.  Eyes:     Conjunctiva/sclera: Conjunctivae normal.  Cardiovascular:     Rate and Rhythm: Normal rate and regular rhythm.     Heart sounds: Normal heart sounds.  Pulmonary:     Effort: Pulmonary effort is normal.     Breath sounds: Normal breath sounds.  Skin:    General: Skin is warm.  Neurological:     Mental Status: She is alert and oriented to person, place, and time.  Psychiatric:        Attention and Perception: Attention normal.        Mood and Affect: Mood normal.        Speech: Speech normal.        Behavior: Behavior is cooperative.     No results found for any visits on 02/12/21.  Assessment & Plan     Problem List Items Addressed This Visit       Other   HLD (hyperlipidemia)    Historically we will check lipids today.  Patient did have a bowl of  cereal this morning, comfortable still getting labs.      Relevant Orders   Comprehensive Metabolic Panel (CMET)   Lipid Profile   Nasal congestion - Primary    May be allergic in nature.  Rx azelastine Flonase instructed to alternate 1 in a.m. and 1 PM for at least 2 weeks to see if this brings benefit.  Advise she start taking antihistamine over-the-counter, Zyrtec, Claritin,  Allegra, Xyzal.  Of her choice.  Advise she try this for at least a month, if her symptoms still persist we can try Singulair.  At this time I do not believe she has a upper respiratory infection, but due to the recent exposure to others advised her to test for COVID.  I also advise she try taking Tums to see if this relieves any of her symptoms to her history of acid reflux and constant postnasal drip.      Relevant Medications   azelastine (ASTELIN) 0.1 % nasal spray   fluticasone (FLONASE) 50 MCG/ACT nasal spray   Hair loss    Likely related to postmenopausal changes.  We will check CBC for anemia and TSH today.      Relevant Orders   TSH + free T4   Comprehensive Metabolic Panel (CMET)   CBC     Return in about 6 months (around 08/13/2021), or if symptoms worsen or fail to improve, for CPE.     I, Mikey Kirschner, PA-C have reviewed all documentation for this visit. The documentation on  02/12/2021 for the exam, diagnosis, procedures, and orders are all accurate and complete.    Mikey Kirschner, PA-C  [facility] [phone] (phone) [phone] (fax)  [place]

## 2021-02-12 NOTE — Assessment & Plan Note (Signed)
Likely related to postmenopausal changes.  We will check CBC for anemia and TSH today.

## 2021-02-13 LAB — TSH+FREE T4
Free T4: 1.27 ng/dL (ref 0.82–1.77)
TSH: 1.55 u[IU]/mL (ref 0.450–4.500)

## 2021-02-13 LAB — CBC
Hematocrit: 42.7 % (ref 34.0–46.6)
Hemoglobin: 14.5 g/dL (ref 11.1–15.9)
MCH: 31.4 pg (ref 26.6–33.0)
MCHC: 34 g/dL (ref 31.5–35.7)
MCV: 92 fL (ref 79–97)
Platelets: 215 10*3/uL (ref 150–450)
RBC: 4.62 x10E6/uL (ref 3.77–5.28)
RDW: 12.2 % (ref 11.7–15.4)
WBC: 6.8 10*3/uL (ref 3.4–10.8)

## 2021-02-13 LAB — COMPREHENSIVE METABOLIC PANEL
ALT: 15 IU/L (ref 0–32)
AST: 17 IU/L (ref 0–40)
Albumin/Globulin Ratio: 2.1 (ref 1.2–2.2)
Albumin: 4.4 g/dL (ref 3.8–4.8)
Alkaline Phosphatase: 69 IU/L (ref 44–121)
BUN/Creatinine Ratio: 21 (ref 12–28)
BUN: 16 mg/dL (ref 8–27)
Bilirubin Total: <0.2 mg/dL (ref 0.0–1.2)
CO2: 25 mmol/L (ref 20–29)
Calcium: 9.7 mg/dL (ref 8.7–10.3)
Chloride: 105 mmol/L (ref 96–106)
Creatinine, Ser: 0.76 mg/dL (ref 0.57–1.00)
Globulin, Total: 2.1 g/dL (ref 1.5–4.5)
Glucose: 72 mg/dL (ref 70–99)
Potassium: 5.3 mmol/L — ABNORMAL HIGH (ref 3.5–5.2)
Sodium: 142 mmol/L (ref 134–144)
Total Protein: 6.5 g/dL (ref 6.0–8.5)
eGFR: 89 mL/min/{1.73_m2} (ref >59)

## 2021-02-13 LAB — LIPID PANEL
Chol/HDL Ratio: 4 ratio (ref 0.0–4.4)
Cholesterol, Total: 216 mg/dL — ABNORMAL HIGH (ref 100–199)
HDL: 54 mg/dL (ref >39)
LDL Chol Calc (NIH): 134 mg/dL — ABNORMAL HIGH (ref 0–99)
Triglycerides: 160 mg/dL — ABNORMAL HIGH (ref 0–149)
VLDL Cholesterol Cal: 28 mg/dL (ref 5–40)

## 2021-02-17 ENCOUNTER — Encounter: Payer: Self-pay | Admitting: Physician Assistant

## 2021-02-17 DIAGNOSIS — Z8379 Family history of other diseases of the digestive system: Secondary | ICD-10-CM

## 2021-02-17 DIAGNOSIS — Z1211 Encounter for screening for malignant neoplasm of colon: Secondary | ICD-10-CM

## 2021-02-17 DIAGNOSIS — K635 Polyp of colon: Secondary | ICD-10-CM

## 2021-02-18 DIAGNOSIS — K635 Polyp of colon: Secondary | ICD-10-CM | POA: Insufficient documentation

## 2021-02-18 DIAGNOSIS — Z8379 Family history of other diseases of the digestive system: Secondary | ICD-10-CM | POA: Insufficient documentation

## 2021-03-25 ENCOUNTER — Other Ambulatory Visit: Payer: Self-pay | Admitting: Family Medicine

## 2021-03-25 DIAGNOSIS — F988 Other specified behavioral and emotional disorders with onset usually occurring in childhood and adolescence: Secondary | ICD-10-CM

## 2021-03-27 MED ORDER — AMPHETAMINE-DEXTROAMPHETAMINE 10 MG PO TABS: 10.0000 mg | ORAL_TABLET | Freq: Every day | ORAL | 0 refills | Status: DC

## 2021-03-31 NOTE — Addendum Note (Signed)
Addended by: Julieta Bellini on: 03/31/2021 04:56 PM   Modules accepted: Orders

## 2021-03-31 NOTE — Telephone Encounter (Signed)
Pt called saying [facility] has not had this mediation in stock in months but the [facility] does have it and said Dr. Rosanna Randy would have to send n order over there for her to be able to get it.   CB#  [phone]

## 2021-04-01 ENCOUNTER — Encounter: Payer: Self-pay | Admitting: Family Medicine

## 2021-04-01 MED ORDER — AMPHETAMINE-DEXTROAMPHETAMINE 10 MG PO TABS: 10.0000 mg | ORAL_TABLET | Freq: Every day | ORAL | 0 refills | Status: DC

## 2021-04-03 NOTE — Telephone Encounter (Signed)
Patient advised.

## 2021-04-14 ENCOUNTER — Other Ambulatory Visit: Payer: Self-pay

## 2021-04-14 ENCOUNTER — Encounter: Payer: Self-pay | Admitting: Gastroenterology

## 2021-04-14 ENCOUNTER — Ambulatory Visit (INDEPENDENT_AMBULATORY_CARE_PROVIDER_SITE_OTHER): Payer: BC Managed Care – PPO | Admitting: Gastroenterology

## 2021-04-14 VITALS — BP 145/99 | HR 83 | Temp 97.7°F | Ht 62.0 in | Wt 142.5 lb

## 2021-04-14 DIAGNOSIS — Z1211 Encounter for screening for malignant neoplasm of colon: Secondary | ICD-10-CM | POA: Diagnosis not present

## 2021-04-14 MED ORDER — NA SULFATE-K SULFATE-MG SULF 17.5-3.13-1.6 GM/177ML PO SOLN
354.0000 mL | Freq: Once | ORAL | 0 refills | Status: AC
Start: 2021-04-14 — End: 2021-04-14

## 2021-04-14 MED ORDER — NA SULFATE-K SULFATE-MG SULF 17.5-3.13-1.6 GM/177ML PO SOLN: 354.0000 mL | Freq: Once | ORAL | 0 refills | Status: AC

## 2021-04-14 NOTE — Progress Notes (Signed)
Cephas Darby, MD [address]  [place]  [place], [place] [address]  Main: [phone]  Fax: [phone]    Gastroenterology Consultation  Referring Provider:     Gwyneth Sprout, FNP Primary Care Physician:  Jerrol Banana., MD Primary Gastroenterologist:  Dr. Cephas Darby Reason for Consultation:     Discuss about colonoscopy        HPI:   Kathy Barnes is a 61 y.o. female referred by Dr. Rosanna Randy, Retia Passe., MD  for consultation & management of discuss about colonoscopy.  Patient had a colonoscopy in 2016 for colon cancer screening.  It was suboptimal due to fair prep.  A small polyp was removed which was benign lymphoid polyp.  Patient reports that she has occasional heartburn for which she takes antacids as needed only.  She had an upper endoscopy in 2016 which revealed small hiatal hernia.  H. pylori negative.  Patient denies having any lower GI symptoms She is status postcholecystectomy  NSAIDs: None  Antiplts/Anticoagulants/Anti thrombotics: None  GI Procedures:  Upper endoscopy for heartburn and colonoscopy for colon cancer screening 12/13/2014 - Small hiatus hernia. - Gastritis. Biopsied. - Normal examined duodenum.  - One 3 mm polyp in the sigmoid colon. Resected and retrieved.  Fair prep Diagnosis 1. 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 - WARTHIN-STARRY STAIN IS NEGATIVE FOR HELICOBACTER PYLORI. - NO INTESTINAL METAPLASIA, DYSPLASIA, OR MALIGNANCY. 2. Colon, polyp(s), sigmoid - BENIGN LYMPHOID POLYP (X1). - NO DYSPLASIA OR MALIGNANCY IDENTIFIED.  Past Medical History:  Diagnosis Date   Abnormal uterine bleeding    Anxiety    manage without medications.   Fibromyalgia    GERD (gastroesophageal reflux disease)    History of uterine fibroid 2011   Psoriasis    Vertigo    last episode approx 12 yrs ago    Past Surgical History:  Procedure Laterality Date   ANKLE SURGERY     BIOPSY  ENDOMETRIAL     CHOLECYSTECTOMY     COLONOSCOPY WITH PROPOFOL N/A 12/13/2014   Procedure: COLONOSCOPY WITH PROPOFOL;  Surgeon: Lucilla Lame, MD;  Location: [place];  Service: Endoscopy;  Laterality: N/A;   DILATION AND CURETTAGE OF UTERUS     ESOPHAGOGASTRODUODENOSCOPY (EGD) WITH PROPOFOL N/A 12/13/2014   Procedure: ESOPHAGOGASTRODUODENOSCOPY (EGD) WITH PROPOFOL;  Surgeon: Lucilla Lame, MD;  Location: [place];  Service: Endoscopy;  Laterality: N/A;   FOOT SURGERY Left    HERNIA REPAIR     POLYPECTOMY  12/13/2014   Procedure: POLYPECTOMY;  Surgeon: Lucilla Lame, MD;  Location: [place];  Service: Endoscopy;;   TONSILLECTOMY     TUBAL LIGATION     WISDOM TOOTH EXTRACTION     x4   Current Outpatient Medications:    amphetamine-dextroamphetamine (ADDERALL) 10 MG tablet, Take 1 tablet (10 mg total) by mouth daily., Disp: 30 tablet, Rfl: 0   estradiol (ESTRACE) 0.1 MG/GM vaginal cream, estradiol 0.01% (0.1 mg/gram) vaginal cream, Disp: , Rfl:    MULTIPLE VITAMIN PO, Take by mouth., Disp: , Rfl:    Na Sulfate-K Sulfate-Mg Sulf 17.5-3.13-1.6 GM/177ML SOLN, Take 354 mLs by mouth once for 1 dose., Disp: 354 mL, Rfl: 0   Na Sulfate-K Sulfate-Mg Sulf 17.5-3.13-1.6 GM/177ML SOLN, Take 354 mLs by mouth once for 1 dose., Disp: 708 mL, Rfl: 0    Family History  Problem Relation Age of Onset   Hypertension Mother    Arthritis Mother  Hypertension Father    Asthma Father    Hyperlipidemia Father    Diabetes Maternal Grandmother    Cancer Maternal Grandmother        kidney   Stroke Maternal Grandfather    Diabetes Paternal Grandmother    Heart disease Paternal Grandfather    ADD / ADHD Son    Hypertension Brother      Social History   Tobacco Use   Smoking status: Never   Smokeless tobacco: Never  Substance Use Topics   Alcohol use: No   Drug use: No    Allergies as of 04/14/2021 - Review Complete 04/14/2021  Allergen Reaction Noted   Ciprofloxacin   07/09/2014   Sulfa antibiotics Other (See Comments) 09/24/2013    Review of Systems:    All systems reviewed and negative except where noted in HPI.   Physical Exam:  BP (!) 145/99 (BP Location: Left Arm, Patient Position: Sitting, Cuff Size: Normal)    Pulse 83    Temp 97.7 F (36.5 C) (Oral)    Ht 5\' 2"  (1.575 m)    Wt 142 lb 8 oz (64.6 kg)    LMP 07/23/2009    BMI 26.06 kg/m  Patient's last menstrual period was 07/23/2009.  General:   Alert,  Well-developed, well-nourished, pleasant and cooperative in NAD Head:  Normocephalic and atraumatic. Eyes:  Sclera clear, no icterus.   Conjunctiva pink. Ears:  Normal auditory acuity. Nose:  No deformity, discharge, or lesions. Mouth:  No deformity or lesions,oropharynx pink & moist. Neck:  Supple; no masses or thyromegaly. Lungs:  Respirations even and unlabored.  Clear throughout to auscultation.   No wheezes, crackles, or rhonchi. No acute distress. Heart:  Regular rate and rhythm; no murmurs, clicks, rubs, or gallops. Abdomen:  Normal bowel sounds. Soft, non-tender and non-distended without masses, hepatosplenomegaly or hernias noted.  No guarding or rebound tenderness.   Rectal: Not performed Msk:  Symmetrical without gross deformities. Good, equal movement & strength bilaterally. Pulses:  Normal pulses noted. Extremities:  No clubbing or edema.  No cyanosis. Neurologic:  Alert and oriented x3;  grossly normal neurologically. Skin:  Intact without significant lesions or rashes. No jaundice. Psych:  Alert and cooperative. Normal mood and affect.  Imaging Studies: No abdominal imaging  Assessment and Plan:   Kathy Barnes is a 62 y.o. pleasant Caucasian female with no significant past medical history is seen in consultation to discuss about colonoscopy.  Patient had a colonoscopy in 2016 with fair prep.  Recommend screening colonoscopy with 2-day prep I have discussed alternative options, risks & benefits,  which include, but are not  limited to, bleeding, infection, perforation,respiratory complication & drug reaction.  The patient agrees with this plan & written consent will be obtained.      Follow up as needed   Cephas Darby, MD

## 2021-04-21 ENCOUNTER — Telehealth: Payer: Self-pay | Admitting: Gastroenterology

## 2021-04-21 NOTE — Telephone Encounter (Signed)
Called and left a message for call back  

## 2021-04-21 NOTE — Telephone Encounter (Signed)
Patient states she needs to reschedule colonoscopy because her dad has been in and out of the hospital and she is taking care of him.

## 2021-04-22 NOTE — Telephone Encounter (Signed)
Patient states that her 62 year old dad had dementia and just got out the hospital. She states she is having to take care of him right now. She would like to move procedure to 05/13/21 ?

## 2021-05-13 ENCOUNTER — Ambulatory Visit: Payer: BC Managed Care – PPO | Admitting: Anesthesiology

## 2021-05-13 ENCOUNTER — Other Ambulatory Visit: Payer: Self-pay

## 2021-05-13 ENCOUNTER — Encounter: Payer: Self-pay | Admitting: Gastroenterology

## 2021-05-13 ENCOUNTER — Ambulatory Visit
Admission: RE | Admit: 2021-05-13 | Discharge: 2021-05-13 | Disposition: A | Discharge status: Home / Self Care | Payer: BC Managed Care – PPO | Source: Ambulatory Visit | Attending: Gastroenterology | Admitting: Gastroenterology

## 2021-05-13 ENCOUNTER — Encounter
Admission: RE | Disposition: A | Discharge status: Home / Self Care | Payer: Self-pay | Source: Ambulatory Visit | Attending: Gastroenterology

## 2021-05-13 DIAGNOSIS — F909 Attention-deficit hyperactivity disorder, unspecified type: Secondary | ICD-10-CM | POA: Diagnosis not present

## 2021-05-13 DIAGNOSIS — Z1211 Encounter for screening for malignant neoplasm of colon: Secondary | ICD-10-CM | POA: Diagnosis not present

## 2021-05-13 HISTORY — PX: COLONOSCOPY WITH PROPOFOL: SHX5780

## 2021-05-13 SURGERY — COLONOSCOPY WITH PROPOFOL
Anesthesia: General

## 2021-05-13 MED ORDER — LIDOCAINE HCL (CARDIAC) PF 100 MG/5ML IV SOSY
PREFILLED_SYRINGE | INTRAVENOUS | Status: DC | PRN
  Administered 2021-05-13: 50 mg via INTRAVENOUS

## 2021-05-13 MED ORDER — PROPOFOL 10 MG/ML IV BOLUS
INTRAVENOUS | Status: AC
  Filled 2021-05-13: qty 20

## 2021-05-13 MED ORDER — PROPOFOL 500 MG/50ML IV EMUL
INTRAVENOUS | Status: DC | PRN
  Administered 2021-05-13: 125 ug/kg/min via INTRAVENOUS

## 2021-05-13 MED ORDER — SODIUM CHLORIDE 0.9 % IV SOLN: INTRAVENOUS | Status: DC

## 2021-05-13 MED ORDER — GLYCOPYRROLATE 0.2 MG/ML IJ SOLN
INTRAMUSCULAR | Status: AC
  Filled 2021-05-13: qty 1

## 2021-05-13 MED ORDER — EPHEDRINE 5 MG/ML INJ
INTRAVENOUS | Status: AC
  Filled 2021-05-13: qty 5

## 2021-05-13 MED ORDER — PROPOFOL 500 MG/50ML IV EMUL
INTRAVENOUS | Status: AC
  Filled 2021-05-13: qty 50

## 2021-05-13 MED ORDER — PROPOFOL 10 MG/ML IV BOLUS
INTRAVENOUS | Status: DC | PRN
  Administered 2021-05-13 (×2): 30 mg via INTRAVENOUS
  Administered 2021-05-13: 70 mg via INTRAVENOUS

## 2021-05-13 NOTE — Op Note (Signed)
[facility] ?Gastroenterology ?Patient Name: Kathy Barnes ?Procedure Date: 05/13/2021 10:09 AM ?MRN: [id number] ?Account #: [id number] ?Date of Birth: Nov 19, 1960 ?Admit Type: Outpatient ?Age: 61 ?Room: [facility] ENDO ROOM 4 ?Gender: Female ?Note Status: Finalized ?Instrument Name: Peds Colonoscope [id number] ?Procedure:             Colonoscopy ?Indications:           Screening for colorectal malignant neoplasm ?Providers:             Lin Landsman MD, MD ?Medicines:             General Anesthesia ?Complications:         No immediate complications. Estimated blood loss: None. ?Procedure:             Pre-Anesthesia Assessment: ?                       - Prior to the procedure, a History and Physical was  ?                       performed, and patient medications and allergies were  ?                       reviewed. The patient is competent. The risks and  ?                       benefits of the procedure and the sedation options and  ?                       risks were discussed with the patient. All questions  ?                       were answered and informed consent was obtained.  ?                       Patient identification and proposed procedure were  ?                       verified by the physician, the nurse, the  ?                       anesthesiologist, the anesthetist and the technician  ?                       in the pre-procedure area in the procedure room in the  ?                       endoscopy suite. Mental Status Examination: alert and  ?                       oriented. Airway Examination: normal oropharyngeal  ?                       airway and neck mobility. Respiratory Examination:  ?                       clear to auscultation. CV Examination: normal.  ?                       Prophylactic Antibiotics:  The patient does not require  ?                       prophylactic antibiotics. Prior Anticoagulants: The  ?                       patient has taken no previous anticoagulant or  ?                        antiplatelet agents. ASA Grade Assessment: II - A  ?                       patient with mild systemic disease. After reviewing  ?                       the risks and benefits, the patient was deemed in  ?                       satisfactory condition to undergo the procedure. The  ?                       anesthesia plan was to use general anesthesia.  ?                       Immediately prior to administration of medications,  ?                       the patient was re-assessed for adequacy to receive  ?                       sedatives. The heart rate, respiratory rate, oxygen  ?                       saturations, blood pressure, adequacy of pulmonary  ?                       ventilation, and response to care were monitored  ?                       throughout the procedure. The physical status of the  ?                       patient was re-assessed after the procedure. ?                       After obtaining informed consent, the colonoscope was  ?                       passed under direct vision. Throughout the procedure,  ?                       the patient's blood pressure, pulse, and oxygen  ?                       saturations were monitored continuously. The  ?                       Colonoscope was introduced through the anus and  ?  advanced to the the cecum, identified by appendiceal  ?                       orifice and ileocecal valve. The colonoscopy was  ?                       performed without difficulty. The patient tolerated  ?                       the procedure well. The quality of the bowel  ?                       preparation was evaluated using the BBPS [facility] Bowel  ?                       Preparation Scale) with scores of: Right Colon = 3,  ?                       Transverse Colon = 3 and Left Colon = 3 (entire mucosa  ?                       seen well with no residual staining, small fragments  ?                       of stool or opaque liquid). The total  BBPS score  ?                       equals 9. ?Findings: ?     The perianal and digital rectal examinations were normal. Pertinent  ?     negatives include normal sphincter tone and no palpable rectal lesions. ?     The entire examined colon appeared normal. ?     The retroflexed view of the distal rectum and anal verge was normal and  ?     showed no anal or rectal abnormalities. ?Impression:            - The entire examined colon is normal. ?                       - The distal rectum and anal verge are normal on  ?                       retroflexion view. ?                       - No specimens collected. ?Recommendation:        - Discharge patient to home (with escort). ?                       - Resume previous diet today. ?                       - Continue present medications. ?                       - Repeat colonoscopy in 10 years for screening  ?                       purposes. ?Procedure Code(s):     ---  Professional --- ?                       B2841, Colorectal cancer screening; colonoscopy on  ?                       individual not meeting criteria for high risk ?Diagnosis Code(s):     --- Professional --- ?                       Z12.11, Encounter for screening for malignant neoplasm  ?                       of colon ?CPT copyright 2019 American Medical Association. All rights reserved. ?The codes documented in this report are preliminary and upon coder review may  ?be revised to meet current compliance requirements. ?Dr. Ulyess Mort ?Brittiney Dicostanzo Raeanne Gathers MD, MD ?05/13/2021 10:36:53 AM ?This report has been signed electronically. ?Number of Addenda: 0 ?Note Initiated On: 05/13/2021 10:09 AM ?Scope Withdrawal Time: 0 hours 10 minutes 13 seconds  ?Total Procedure Duration: 0 hours 15 minutes 3 seconds  ?Estimated Blood Loss:  Estimated blood loss: none. ?     [facility] ?

## 2021-05-13 NOTE — Anesthesia Preprocedure Evaluation (Addendum)
Anesthesia Evaluation  ?Patient identified by MRN, date of birth, ID band ?Patient awake ? ? ? ?Reviewed: ?Allergy & Precautions, NPO status , Patient's Chart, lab work & pertinent test results ? ?History of Anesthesia Complications ?Negative for: history of anesthetic complications ? ?Airway ?Mallampati: II ? ? ?Neck ROM: Full ? ? ? Dental ?no notable dental hx. ? ?  ?Pulmonary ?neg pulmonary ROS,  ?  ?Pulmonary exam normal ?breath sounds clear to auscultation ? ? ? ? ? ? Cardiovascular ?Exercise Tolerance: Good ?negative cardio ROS ?Normal cardiovascular exam ?Rhythm:Regular Rate:Normal ? ? ?  ?Neuro/Psych ?PSYCHIATRIC DISORDERS (ADD) Anxiety negative neurological ROS ?   ? GI/Hepatic ?GERD  ,  ?Endo/Other  ?negative endocrine ROS ? Renal/GU ?negative Renal ROS  ? ?  ?Musculoskeletal ? ?(+) Fibromyalgia - ? Abdominal ?  ?Peds ? Hematology ?negative hematology ROS ?(+)   ?Anesthesia Other Findings ? ? Reproductive/Obstetrics ?Uterine fibroids ? ?  ? ? ? ? ? ? ? ? ? ? ? ? ? ?  ?  ? ? ? ? ? ? ? ?Anesthesia Physical ?Anesthesia Plan ? ?ASA: 2 ? ?Anesthesia Plan: General  ? ?Post-op Pain Management:   ? ?Induction: Intravenous ? ?PONV Risk Score and Plan: 3 and Propofol infusion, TIVA and Treatment may vary due to age or medical condition ? ?Airway Management Planned: Natural Airway ? ?Additional Equipment:  ? ?Intra-op Plan:  ? ?Post-operative Plan:  ? ?Informed Consent: I have reviewed the patients History and Physical, chart, labs and discussed the procedure including the risks, benefits and alternatives for the proposed anesthesia with the patient or authorized representative who has indicated his/her understanding and acceptance.  ? ? ? ? ? ?Plan Discussed with: CRNA ? ?Anesthesia Plan Comments: (LMA/GETA backup discussed.  Patient consented for risks of anesthesia including but not limited to:  ?- adverse reactions to medications ?- damage to eyes, teeth, lips or other oral  mucosa ?- nerve damage due to positioning  ?- sore throat or hoarseness ?- damage to heart, brain, nerves, lungs, other parts of body or loss of life ? ?Informed patient about role of CRNA in peri- and intra-operative care.  Patient voiced understanding.)  ? ? ? ? ? ? ?Anesthesia Quick Evaluation ? ?

## 2021-05-13 NOTE — Anesthesia Postprocedure Evaluation (Signed)
Anesthesia Post Note ? ?Patient: Kathy Barnes ? ?Procedure(s) Performed: COLONOSCOPY WITH PROPOFOL ? ?Patient location during evaluation: PACU ?Anesthesia Type: General ?Level of consciousness: awake and alert, oriented and patient cooperative ?Pain management: pain level controlled ?Vital Signs Assessment: post-procedure vital signs reviewed and stable ?Respiratory status: spontaneous breathing, nonlabored ventilation and respiratory function stable ?Cardiovascular status: blood pressure returned to baseline and stable ?Postop Assessment: adequate PO intake ?Anesthetic complications: no ? ? ?No notable events documented. ? ? ?Last Vitals:  ?Vitals:  ? 05/13/21 1050 05/13/21 1100  ?BP: (!) 87/67 92/65  ?Pulse: 77 66  ?Resp: 15 12  ?Temp:    ?SpO2: 93% 100%  ?  ?Last Pain:  ?Vitals:  ? 05/13/21 1037  ?TempSrc: Temporal  ?PainSc:   ? ? ?  ?  ?  ?  ?  ?  ? ?Darrin Nipper ? ? ? ? ?

## 2021-05-13 NOTE — H&P (Signed)
?Cephas Darby, MD ?[address]  ?[address]  ?[place],  [address]  ?Main: [phone]  ?Fax: [phone] ?Pager: [phone] ? ?Primary Care Physician:  Jerrol Banana., MD ?Primary Gastroenterologist:  Dr. Cephas Darby ? ?Pre-Procedure History & Physical: ?HPI:  Kathy Barnes is a 61 y.o. female is here for an colonoscopy. ?  ?Past Medical History:  ?Diagnosis Date  ? Abnormal uterine bleeding   ? Anxiety   ? manage without medications.  ? Fibromyalgia   ? GERD (gastroesophageal reflux disease)   ? History of uterine fibroid 2011  ? Psoriasis   ? Vertigo   ? last episode approx 12 yrs ago  ? ? ?Past Surgical History:  ?Procedure Laterality Date  ? ANKLE SURGERY    ? BIOPSY ENDOMETRIAL    ? CHOLECYSTECTOMY    ? COLONOSCOPY WITH PROPOFOL N/A 12/13/2014  ? Procedure: COLONOSCOPY WITH PROPOFOL;  Surgeon: Lucilla Lame, MD;  Location: [place];  Service: Endoscopy;  Laterality: N/A;  ? DILATION AND CURETTAGE OF UTERUS    ? ESOPHAGOGASTRODUODENOSCOPY (EGD) WITH PROPOFOL N/A 12/13/2014  ? Procedure: ESOPHAGOGASTRODUODENOSCOPY (EGD) WITH PROPOFOL;  Surgeon: Lucilla Lame, MD;  Location: [place];  Service: Endoscopy;  Laterality: N/A;  ? FOOT SURGERY Left   ? HERNIA REPAIR    ? POLYPECTOMY  12/13/2014  ? Procedure: POLYPECTOMY;  Surgeon: Lucilla Lame, MD;  Location: [place];  Service: Endoscopy;;  ? TONSILLECTOMY    ? TUBAL LIGATION    ? WISDOM TOOTH EXTRACTION    ? x4  ? ? ?Prior to Admission medications   ?Medication Sig Start Date End Date Taking? Authorizing Provider  ?amphetamine-dextroamphetamine (ADDERALL) 10 MG tablet Take 1 tablet (10 mg total) by mouth daily. 04/01/21   Jerrol Banana., MD  ?estradiol (ESTRACE) 0.1 MG/GM vaginal cream estradiol 0.01% (0.1 mg/gram) vaginal cream    [provider]  ?MULTIPLE VITAMIN PO Take by mouth. 04/15/11   [provider]  ? ? ?Allergies as of 04/14/2021 - Review Complete 04/14/2021  ?Allergen  Reaction Noted  ? Ciprofloxacin  07/09/2014  ? Sulfa antibiotics Other (See Comments) 09/24/2013  ? ? ?Family History  ?Problem Relation Age of Onset  ? Hypertension Mother   ? Arthritis Mother   ? Hypertension Father   ? Asthma Father   ? Hyperlipidemia Father   ? Diabetes Maternal Grandmother   ? Cancer Maternal Grandmother   ?     kidney  ? Stroke Maternal Grandfather   ? Diabetes Paternal Grandmother   ? Heart disease Paternal Grandfather   ? ADD / ADHD Son   ? Hypertension Brother   ? ? ?Social History  ? ?Socioeconomic History  ? Marital status: Married  ?  Spouse name: Not on file  ? Number of children: Not on file  ? Years of education: Not on file  ? Highest education level: Not on file  ?Occupational History  ? Not on file  ?Tobacco Use  ? Smoking status: Never  ? Smokeless tobacco: Never  ?Vaping Use  ? Vaping Use: Never used  ?Substance and Sexual Activity  ? Alcohol use: No  ? Drug use: No  ? Sexual activity: Yes  ?  Partners: Male  ?  Birth control/protection: Post-menopausal  ?Other Topics Concern  ? Not on file  ?Social History Narrative  ? Not on file  ? ?Social Determinants of Health  ? ?Financial Resource Strain: Not on file  ?Food Insecurity: Not on  file  ?Transportation Needs: Not on file  ?Physical Activity: Not on file  ?Stress: Not on file  ?Social Connections: Not on file  ?Intimate Partner Violence: Not on file  ? ? ?Review of Systems: ?See HPI, otherwise negative ROS ? ?Physical Exam: ?BP 138/90   Pulse 76   Temp 98.5 ?F (36.9 ?C) (Temporal)   Resp 14   Ht '5\' 2"'$  (1.575 m)   Wt 63 kg   LMP 07/23/2009   SpO2 100%   BMI 25.42 kg/m?  ?General:   Alert,  pleasant and cooperative in NAD ?Head:  Normocephalic and atraumatic. ?Neck:  Supple; no masses or thyromegaly. ?Lungs:  Clear throughout to auscultation.    ?Heart:  Regular rate and rhythm. ?Abdomen:  Soft, nontender and nondistended. Normal bowel sounds, without guarding, and without rebound.   ?Neurologic:  Alert and  oriented x4;   grossly normal neurologically. ? ?Impression/Plan: ?Kathy Barnes is here for an colonoscopy to be performed for screening colonoscopy  ? ?Risks, benefits, limitations, and alternatives regarding  colonoscopy have been reviewed with the patient.  Questions have been answered.  All parties agreeable. ? ? ?Sherri Sear, MD  05/13/2021, 10:09 AM ?

## 2021-05-13 NOTE — Transfer of Care (Signed)
Immediate Anesthesia Transfer of Care Note ? ?Patient: Kathy Barnes ? ?Procedure(s) Performed: COLONOSCOPY WITH PROPOFOL ? ?Patient Location: PACU and Endoscopy Unit ? ?Anesthesia Type:General ? ?Level of Consciousness: drowsy ? ?Airway & Oxygen Therapy: Patient Spontanous Breathing ? ?Post-op Assessment: Report given to RN ? ?Post vital signs: stable ? ?Last Vitals:  ?Vitals Value Taken Time  ?BP    ?Temp    ?Pulse    ?Resp    ?SpO2    ? ? ?Last Pain:  ?Vitals:  ? 05/13/21 0944  ?TempSrc: Temporal  ?PainSc: 0-No pain  ?   ? ?  ? ?Complications: No notable events documented. ?

## 2021-05-15 ENCOUNTER — Encounter: Payer: Self-pay | Admitting: Gastroenterology

## 2021-06-15 DIAGNOSIS — F909 Attention-deficit hyperactivity disorder, unspecified type: Secondary | ICD-10-CM | POA: Insufficient documentation

## 2021-06-15 DIAGNOSIS — M797 Fibromyalgia: Secondary | ICD-10-CM

## 2021-06-15 HISTORY — DX: Fibromyalgia: M79.7

## 2021-06-23 DIAGNOSIS — Z6826 Body mass index (BMI) 26.0-26.9, adult: Secondary | ICD-10-CM | POA: Diagnosis not present

## 2021-06-23 DIAGNOSIS — Z01419 Encounter for gynecological examination (general) (routine) without abnormal findings: Secondary | ICD-10-CM | POA: Diagnosis not present

## 2021-06-23 DIAGNOSIS — Z1231 Encounter for screening mammogram for malignant neoplasm of breast: Secondary | ICD-10-CM | POA: Diagnosis not present

## 2021-06-23 LAB — HM MAMMOGRAPHY: HM Mammogram: NORMAL

## 2021-06-23 LAB — HM PAP SMEAR: HM Pap smear: NEGATIVE

## 2021-07-11 DIAGNOSIS — N904 Leukoplakia of vulva: Secondary | ICD-10-CM | POA: Insufficient documentation

## 2021-07-14 ENCOUNTER — Ambulatory Visit (INDEPENDENT_AMBULATORY_CARE_PROVIDER_SITE_OTHER): Payer: BC Managed Care – PPO | Admitting: Family Medicine

## 2021-07-14 ENCOUNTER — Telehealth: Payer: Self-pay

## 2021-07-14 ENCOUNTER — Ambulatory Visit
Admission: RE | Admit: 2021-07-14 | Discharge: 2021-07-14 | Disposition: A | Discharge status: Home / Self Care | Payer: BC Managed Care – PPO | Source: Ambulatory Visit | Attending: Family Medicine | Admitting: Family Medicine

## 2021-07-14 ENCOUNTER — Encounter: Payer: Self-pay | Admitting: Family Medicine

## 2021-07-14 ENCOUNTER — Ambulatory Visit
Admission: RE | Admit: 2021-07-14 | Discharge: 2021-07-14 | Disposition: A | Discharge status: Home / Self Care | Payer: BC Managed Care – PPO | Attending: Family Medicine | Admitting: Family Medicine

## 2021-07-14 VITALS — BP 127/77 | HR 76 | Resp 16 | Ht 62.0 in | Wt 140.0 lb

## 2021-07-14 DIAGNOSIS — K5900 Constipation, unspecified: Secondary | ICD-10-CM | POA: Insufficient documentation

## 2021-07-14 DIAGNOSIS — Z8379 Family history of other diseases of the digestive system: Secondary | ICD-10-CM

## 2021-07-14 DIAGNOSIS — R1032 Left lower quadrant pain: Secondary | ICD-10-CM | POA: Insufficient documentation

## 2021-07-14 DIAGNOSIS — F988 Other specified behavioral and emotional disorders with onset usually occurring in childhood and adolescence: Secondary | ICD-10-CM

## 2021-07-14 LAB — POCT URINALYSIS DIPSTICK
Bilirubin, UA: NEGATIVE
Blood, UA: POSITIVE
Glucose, UA: NEGATIVE
Ketones, UA: NEGATIVE
Leukocytes, UA: NEGATIVE
Nitrite, UA: NEGATIVE
Protein, UA: NEGATIVE
Spec Grav, UA: 1.01 (ref 1.010–1.025)
Urobilinogen, UA: 0.2 E.U./dL
pH, UA: 6 (ref 5.0–8.0)

## 2021-07-14 MED ORDER — AMPHETAMINE-DEXTROAMPHETAMINE 10 MG PO TABS: 10.0000 mg | ORAL_TABLET | Freq: Every day | ORAL | 0 refills | Status: DC

## 2021-07-14 NOTE — Telephone Encounter (Signed)
Copied from [place] [id number]. Topic: General - Other >> Jul 14, 2021  3:22 PM Tessa Lerner A wrote: Reason for CRM: The patient has requested to speak with a member of staff when possible  The patient would like to review the results of their recent imaging   Please contact further when possible

## 2021-07-14 NOTE — Telephone Encounter (Signed)
Called patient back and advised results  are not in yet.

## 2021-07-14 NOTE — Progress Notes (Unsigned)
      Established patient visit   Patient: Kathy Barnes   DOB: 12/27/1960   61 y.o. Female  MRN: [id number] Visit Date: 07/14/2021  Today's healthcare provider: Wilhemena Durie, MD   Chief Complaint  Patient presents with   Abdominal Pain   Subjective    Abdominal Pain This is a new problem. The current episode started more than 1 month ago (2 months ago). The onset quality is gradual. The problem occurs constantly. The problem has been gradually worsening. The pain is located in the LLQ. The pain is at a severity of 6/10. The pain is moderate. The quality of the pain is a sensation of fullness, dull and sharp. The abdominal pain radiates to the back (radiates to the lower back). Associated symptoms include constipation. Pertinent negatives include no anorexia, arthralgias, belching, diarrhea, dysuria, fever, flatus, frequency, headaches, hematochezia, hematuria, melena, myalgias, nausea, vomiting or weight loss. Prior diagnostic workup includes lower endoscopy.    Patient has had LLQ abdominal pain for around 2 months since she had her colonoscopy in March. Patient states abdominal pain is constant. Pain radiates to lower back on occasion. Other symptoms include constipation.  Medications: Outpatient Medications Prior to Visit  Medication Sig   amphetamine-dextroamphetamine (ADDERALL) 10 MG tablet Take 1 tablet (10 mg total) by mouth daily.   estradiol (ESTRACE) 0.1 MG/GM vaginal cream estradiol 0.01% (0.1 mg/gram) vaginal cream   MULTIPLE VITAMIN PO Take by mouth.   No facility-administered medications prior to visit.    Review of Systems  Constitutional:  Negative for appetite change, chills, fatigue, fever and weight loss.  Respiratory:  Negative for chest tightness and shortness of breath.   Cardiovascular:  Negative for chest pain and palpitations.  Gastrointestinal:  Positive for abdominal pain and constipation. Negative for anorexia, diarrhea, flatus, hematochezia,  melena, nausea and vomiting.  Genitourinary:  Negative for dysuria, frequency and hematuria.  Musculoskeletal:  Negative for arthralgias and myalgias.  Neurological:  Negative for dizziness, weakness and headaches.   {Labs  Heme  Chem  Endocrine  Serology  Results Review (optional):23779}   Objective    BP 127/77 (BP Location: Right Arm, Patient Position: Sitting, Cuff Size: Normal)   Pulse 76   Resp 16   Ht '5\' 2"'$  (1.575 m)   Wt 140 lb (63.5 kg)   LMP 07/23/2009   SpO2 99%   BMI 25.61 kg/m  {Show previous vital signs (optional):23777}  Physical Exam  ***  No results found for any visits on 07/14/21.  Assessment & Plan     ***  No follow-ups on file.      {provider attestation***:1}   Wilhemena Durie, MD  [facility] [phone] (phone) [phone] (fax)  [place]

## 2021-07-15 NOTE — Telephone Encounter (Signed)
Please advise patient's x-ray result?

## 2021-07-23 ENCOUNTER — Ambulatory Visit: Payer: BC Managed Care – PPO

## 2021-07-28 ENCOUNTER — Ambulatory Visit: Payer: BC Managed Care – PPO | Admitting: Family Medicine

## 2021-09-16 ENCOUNTER — Ambulatory Visit: Payer: BC Managed Care – PPO | Admitting: Family Medicine

## 2021-10-19 ENCOUNTER — Encounter: Payer: Self-pay | Admitting: Family Medicine

## 2021-10-24 ENCOUNTER — Other Ambulatory Visit: Payer: Self-pay | Admitting: Family Medicine

## 2021-10-24 DIAGNOSIS — F988 Other specified behavioral and emotional disorders with onset usually occurring in childhood and adolescence: Secondary | ICD-10-CM

## 2021-10-28 MED ORDER — AMPHETAMINE-DEXTROAMPHETAMINE 10 MG PO TABS: 10.0000 mg | ORAL_TABLET | Freq: Every day | ORAL | 0 refills | Status: DC

## 2021-11-03 ENCOUNTER — Ambulatory Visit (INDEPENDENT_AMBULATORY_CARE_PROVIDER_SITE_OTHER): Payer: BC Managed Care – PPO | Admitting: Family Medicine

## 2021-11-03 ENCOUNTER — Ambulatory Visit
Admission: RE | Admit: 2021-11-03 | Discharge: 2021-11-03 | Disposition: A | Discharge status: Home / Self Care | Payer: BC Managed Care – PPO | Source: Ambulatory Visit | Attending: Family Medicine | Admitting: Family Medicine

## 2021-11-03 ENCOUNTER — Encounter: Payer: Self-pay | Admitting: Family Medicine

## 2021-11-03 ENCOUNTER — Ambulatory Visit
Admission: RE | Admit: 2021-11-03 | Discharge: 2021-11-03 | Disposition: A | Discharge status: Home / Self Care | Payer: BC Managed Care – PPO | Attending: Family Medicine | Admitting: Family Medicine

## 2021-11-03 VITALS — BP 126/87 | HR 84 | Temp 98.7°F | Resp 16 | Wt 145.0 lb

## 2021-11-03 DIAGNOSIS — R051 Acute cough: Secondary | ICD-10-CM

## 2021-11-03 DIAGNOSIS — R059 Cough, unspecified: Secondary | ICD-10-CM | POA: Diagnosis not present

## 2021-11-03 DIAGNOSIS — J189 Pneumonia, unspecified organism: Secondary | ICD-10-CM

## 2021-11-03 DIAGNOSIS — R062 Wheezing: Secondary | ICD-10-CM | POA: Diagnosis not present

## 2021-11-03 MED ORDER — BENZONATATE 200 MG PO CAPS: 200.0000 mg | ORAL_CAPSULE | Freq: Two times a day (BID) | ORAL | 0 refills | Status: DC | PRN

## 2021-11-03 MED ORDER — AMOXICILLIN-POT CLAVULANATE 875-125 MG PO TABS: 1.0000 | ORAL_TABLET | Freq: Two times a day (BID) | ORAL | 0 refills | Status: AC

## 2021-11-03 NOTE — Progress Notes (Signed)
   SUBJECTIVE:   CHIEF COMPLAINT / HPI:   COUGH Duration: weeks Circumstances of initial development of cough: COVID 2.5 weeks ago.  Cough description: productive of mucus Aggravating factors:  worse at night Alleviating factors: none Treatments attempted: robatussin, sudafed Wheezing: yes Shortness of breath: no Chest pain: no Chest tightness:no Hemoptysis: no Fevers: not now Headache: yes, with cough   OBJECTIVE:   BP 126/87 (BP Location: Left Arm, Patient Position: Sitting, Cuff Size: Normal)   Pulse 84   Temp 98.7 F (37.1 C) (Oral)   Resp 16   Wt 145 lb (65.8 kg)   LMP 07/23/2009   SpO2 97%   BMI 26.52 kg/m   Gen: well appearing, in NAD Card: RRR Lungs: faint bibasilar crackles, no wheezing. Appropriately saturated on RA. Comfortable WOB, speaks in full sentences. Ext: WWP, no edema   ASSESSMENT/PLAN:   CAP Post-COVID. Will get CXR. Rx augmentin, tessalon prn for cough. F/u prn. Emergency precautions discussed.   Myles Gip, DO

## 2022-01-11 NOTE — Progress Notes (Unsigned)
     I,Sha'taria Tyson,acting as a Education administrator for [employer], PA-C.,have documented all relevant documentation on the behalf of Mikey Kirschner, PA-C,as directed by  Mikey Kirschner, PA-C while in the presence of Mikey Kirschner, PA-C.    Established patient visit   Patient: Kathy Barnes   DOB: 08/19/1960   61 y.o. Female  MRN: [id number] Visit Date: 01/12/2022  Today's healthcare provider: Mikey Kirschner, PA-C   No chief complaint on file.  Subjective    HPI  Follow up for ADD  The patient was last seen for this 6 months ago. Changes made at last visit include continue current treatment.  She reports {excellent/good/fair/poor:19665} compliance with treatment. She feels that condition is {improved/worse/unchanged:3041574}. She {is/is not:21021397} having side effects. ***  -----------------------------------------------------------------------------------------   Medications: Outpatient Medications Prior to Visit  Medication Sig   amphetamine-dextroamphetamine (ADDERALL) 10 MG tablet Take 1 tablet (10 mg total) by mouth daily.   benzonatate (TESSALON) 200 MG capsule Take 1 capsule (200 mg total) by mouth 2 (two) times daily as needed for cough.   estradiol (ESTRACE) 0.1 MG/GM vaginal cream estradiol 0.01% (0.1 mg/gram) vaginal cream   MULTIPLE VITAMIN PO Take by mouth.   No facility-administered medications prior to visit.    Review of Systems  {Labs  Heme  Chem  Endocrine  Serology  Results Review (optional):23779}   Objective    LMP 07/23/2009  {Show previous vital signs (optional):23777}  Physical Exam  ***  No results found for any visits on 01/12/22.  Assessment & Plan     ***  No follow-ups on file.      {provider attestation***:1}   Mikey Kirschner, PA-C  [facility] [phone] (phone) [phone] (fax)  [place]

## 2022-01-12 ENCOUNTER — Encounter: Payer: Self-pay | Admitting: Physician Assistant

## 2022-01-12 ENCOUNTER — Ambulatory Visit (INDEPENDENT_AMBULATORY_CARE_PROVIDER_SITE_OTHER): Payer: BC Managed Care – PPO | Admitting: Physician Assistant

## 2022-01-12 VITALS — BP 134/80 | HR 72 | Ht 62.0 in | Wt 145.6 lb

## 2022-01-12 DIAGNOSIS — F988 Other specified behavioral and emotional disorders with onset usually occurring in childhood and adolescence: Secondary | ICD-10-CM

## 2022-01-12 DIAGNOSIS — Z23 Encounter for immunization: Secondary | ICD-10-CM

## 2022-01-12 MED ORDER — AMPHETAMINE-DEXTROAMPHETAMINE 10 MG PO TABS: 10.0000 mg | ORAL_TABLET | Freq: Every day | ORAL | 0 refills | Status: DC

## 2022-01-12 NOTE — Addendum Note (Signed)
Addended by: Barnie Mort on: 01/12/2022 10:43 AM   Modules accepted: Orders

## 2022-01-12 NOTE — Assessment & Plan Note (Signed)
Manages with PRN adderall 10 mg, takes ~3 days a week. Chronic + stable, refilled

## 2022-03-02 ENCOUNTER — Other Ambulatory Visit: Payer: Self-pay | Admitting: Physician Assistant

## 2022-03-02 DIAGNOSIS — F988 Other specified behavioral and emotional disorders with onset usually occurring in childhood and adolescence: Secondary | ICD-10-CM

## 2022-03-04 ENCOUNTER — Other Ambulatory Visit: Payer: Self-pay | Admitting: Physician Assistant

## 2022-03-04 DIAGNOSIS — F988 Other specified behavioral and emotional disorders with onset usually occurring in childhood and adolescence: Secondary | ICD-10-CM

## 2022-03-04 MED ORDER — AMPHETAMINE-DEXTROAMPHETAMINE 10 MG PO TABS: 10.0000 mg | ORAL_TABLET | Freq: Every day | ORAL | 0 refills | Status: DC

## 2022-03-04 NOTE — Telephone Encounter (Signed)
Please review.  KP

## 2022-03-14 ENCOUNTER — Encounter: Payer: Self-pay | Admitting: Physician Assistant

## 2022-03-15 ENCOUNTER — Other Ambulatory Visit: Payer: Self-pay | Admitting: Physician Assistant

## 2022-03-15 DIAGNOSIS — E785 Hyperlipidemia, unspecified: Secondary | ICD-10-CM

## 2022-03-15 DIAGNOSIS — K219 Gastro-esophageal reflux disease without esophagitis: Secondary | ICD-10-CM

## 2022-03-30 ENCOUNTER — Encounter: Payer: Self-pay | Admitting: Physician Assistant

## 2022-03-30 ENCOUNTER — Ambulatory Visit (INDEPENDENT_AMBULATORY_CARE_PROVIDER_SITE_OTHER): Payer: BC Managed Care – PPO | Admitting: Physician Assistant

## 2022-03-30 VITALS — BP 127/87 | HR 79 | Temp 98.2°F

## 2022-03-30 DIAGNOSIS — J029 Acute pharyngitis, unspecified: Secondary | ICD-10-CM

## 2022-03-30 MED ORDER — PANTOPRAZOLE SODIUM 40 MG PO TBEC: 40.0000 mg | DELAYED_RELEASE_TABLET | Freq: Every day | ORAL | 0 refills | Status: DC

## 2022-03-30 NOTE — Progress Notes (Signed)
I,Sha'taria Tyson,acting as a Education administrator for [employer], PA-C.,have documented all relevant documentation on the behalf of Mikey Kirschner, PA-C,as directed by  Mikey Kirschner, PA-C while in the presence of Mikey Kirschner, PA-C.   Established patient visit   Patient: Kathy Barnes   DOB: 10-Jun-1960   62 y.o. Female  MRN: [id number] Visit Date: 03/30/2022  Today's healthcare provider: Mikey Kirschner, PA-C   Cc. Hoarseness, sore throat x 4 days  Subjective     Reports it is acid reflux she has had before. Took her second tablet last night Pt reports a sore throat, cough, hoarseness x 2-4 days. Reports her left ear hurts a bit. Denies congestion, SOB, fevers. Reports she thinks it is acid reflux, she has had similar symptoms before. She has taken two doses of famotidine OTC and some TUMS.   Denies chest pain, regurgitation, change in taste.   Medications: Outpatient Medications Prior to Visit  Medication Sig   amphetamine-dextroamphetamine (ADDERALL) 10 MG tablet Take 1 tablet (10 mg total) by mouth daily.   estradiol (ESTRACE) 0.1 MG/GM vaginal cream estradiol 0.01% (0.1 mg/gram) vaginal cream   MULTIPLE VITAMIN PO Take by mouth.   benzonatate (TESSALON) 200 MG capsule Take 1 capsule (200 mg total) by mouth 2 (two) times daily as needed for cough.   No facility-administered medications prior to visit.    Review of Systems  HENT:  Positive for ear pain and sore throat.   Respiratory:  Positive for cough.      Objective    BP 127/87   Pulse 79   Temp 98.2 F (36.8 C) (Oral)   LMP 07/23/2009   SpO2 100%  Blood pressure 127/87, pulse 79, temperature 98.2 F (36.8 C), temperature source Oral, last menstrual period 07/23/2009, SpO2 100 %.   Physical Exam Constitutional:      General: She is awake.     Appearance: She is well-developed.  HENT:     Head: Normocephalic.     Right Ear: Tympanic membrane normal.     Left Ear: Tympanic membrane normal.     Mouth/Throat:      Pharynx: Posterior oropharyngeal erythema present. No oropharyngeal exudate.  Eyes:     Conjunctiva/sclera: Conjunctivae normal.  Cardiovascular:     Rate and Rhythm: Normal rate and regular rhythm.     Heart sounds: Normal heart sounds.  Pulmonary:     Effort: Pulmonary effort is normal.     Breath sounds: Normal breath sounds.  Skin:    General: Skin is warm.  Neurological:     Mental Status: She is alert and oriented to person, place, and time.  Psychiatric:        Attention and Perception: Attention normal.        Mood and Affect: Mood normal.        Speech: Speech normal.        Behavior: Behavior is cooperative.     No results found for any visits on 03/30/22.  Assessment & Plan     Sore throat Advised COVID test, pt declined Can treat as GERD --  Discussed foods to avoid advised famotidine 20 mg BID, tums PRN Rx pantoprazole 40 mg in AM 20 minutes before meals   Pt has f/u scheduled, any worsening or persistent symptoms can discuss at that time  Return if symptoms worsen or fail to improve.      I, Mikey Kirschner, PA-C have reviewed all documentation for this visit. The documentation on  03/30/22  for the exam, diagnosis, procedures, and orders are all accurate and complete.  Mikey Kirschner, PA-C [facility] [address] [place], [place], [address] Office: [phone] Fax: [place]

## 2022-03-31 ENCOUNTER — Other Ambulatory Visit: Payer: Self-pay | Admitting: Physician Assistant

## 2022-03-31 ENCOUNTER — Encounter: Payer: Self-pay | Admitting: Physician Assistant

## 2022-03-31 DIAGNOSIS — J029 Acute pharyngitis, unspecified: Secondary | ICD-10-CM

## 2022-03-31 MED ORDER — OMEPRAZOLE 20 MG PO CPDR: 20.0000 mg | DELAYED_RELEASE_CAPSULE | Freq: Every day | ORAL | 0 refills | Status: DC

## 2022-03-31 NOTE — Addendum Note (Signed)
Addended byMikey Kirschner on: 03/31/2022 02:06 PM   Modules accepted: Orders

## 2022-04-12 DIAGNOSIS — H33313 Horseshoe tear of retina without detachment, bilateral: Secondary | ICD-10-CM | POA: Diagnosis not present

## 2022-04-12 DIAGNOSIS — H33311 Horseshoe tear of retina without detachment, right eye: Secondary | ICD-10-CM | POA: Diagnosis not present

## 2022-04-12 DIAGNOSIS — H33312 Horseshoe tear of retina without detachment, left eye: Secondary | ICD-10-CM | POA: Diagnosis not present

## 2022-04-12 DIAGNOSIS — S0591XA Unspecified injury of right eye and orbit, initial encounter: Secondary | ICD-10-CM | POA: Diagnosis not present

## 2022-04-12 DIAGNOSIS — H35372 Puckering of macula, left eye: Secondary | ICD-10-CM | POA: Diagnosis not present

## 2022-04-12 DIAGNOSIS — H2513 Age-related nuclear cataract, bilateral: Secondary | ICD-10-CM | POA: Diagnosis not present

## 2022-04-15 ENCOUNTER — Encounter: Payer: BC Managed Care – PPO | Admitting: Physician Assistant

## 2022-04-22 ENCOUNTER — Telehealth: Payer: Self-pay | Admitting: Physician Assistant

## 2022-04-22 NOTE — Telephone Encounter (Signed)
[facility] pharmacy requesting prior authorization [employer]: [place]  Name: [place] Pantoprazole Sodium '40mg'$  DR tablets

## 2022-04-27 ENCOUNTER — Other Ambulatory Visit: Payer: Self-pay | Admitting: Physician Assistant

## 2022-04-27 DIAGNOSIS — F988 Other specified behavioral and emotional disorders with onset usually occurring in childhood and adolescence: Secondary | ICD-10-CM

## 2022-04-27 MED ORDER — AMPHETAMINE-DEXTROAMPHETAMINE 10 MG PO TABS: 10.0000 mg | ORAL_TABLET | Freq: Every day | ORAL | 0 refills | Status: DC

## 2022-04-27 NOTE — Telephone Encounter (Signed)
Mikey Kirschner, PA-C to MONTOYA PRUDENT       03/31/22  4:51 PM Looks like your insurance doesn't want to cover any.    I recommend grabbing a bottle of prilosec over the counter, this is the same medication as omeprazole.   Last read by Marney Doctor at  5:01 PM on 03/31/2022.

## 2022-04-28 ENCOUNTER — Telehealth: Payer: Self-pay | Admitting: Physician Assistant

## 2022-04-28 NOTE — Telephone Encounter (Signed)
CoverMyMeds prior authorization request [employer]: [place] Name: Brendalee Binion Sodium 40 mg DR tablets

## 2022-04-28 NOTE — Telephone Encounter (Signed)
[facility]  Advanced Heart Failure and Transplant  Heart Transplant Clinic  Follow-up Visit    Primary Care Physician: Brodsky, Mark E  Referring Provider: Brett Justin Berman  Date of Transplant: 04/13/2019  Organ(s) Transplanted: heart  Indication for transplant: Dilated Myopathy: Idiopathic  PHS increased risk donor: Yes    ID. 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    Interval History:    The patient was last seen on 06/08/21. At that time issues with pain after urologic procedure.    He continues to deal with pain issue largely from prostate surgery. He still has some bleeding and some tissue come out. He tried different strategies and nothing helped. This is really impacting quality of life. He gets tired and frustrated and does not want to take it out on his family.    ROS:  A complete ROS was performed and is negative except as documented in the HPI.      Allergies:  Patient is allergic to cats [other] and dogs [other].    Past Medical History:   Diagnosis Date    Asthma     Atrial fibrillation (CMS-HCC)     Chronic HFrEF (heart failure with reduced ejection fraction) (CMS-HCC)     GERD (gastroesophageal reflux disease)     HTN (hypertension)     Insomnia     Nephrolithiasis     Sinusitis      Patient Active Problem List   Diagnosis    COPD (chronic obstructive pulmonary disease) (CMS-HCC)    Heart transplant, orthotopic, 04/13/2019    Pericardial effusion    Hypertension    Chronic back pain    At risk for infection transmitted from donor    Acute hepatitis C virus infection    Heart transplanted (CMS-HCC)    Acute UTI    Umbilical hernia without obstruction and without gangrene    COVID-19 virus detected    Acute medial meniscus tear of left knee, sequela    Localized osteoarthritis of left knee     Past Surgical History:   Procedure Laterality Date    CARDIAC DEFIBRILLATOR PLACEMENT       PB ANESTH,SHOULDER JOINT,NOS Right      Family History   Problem Relation Name Age of Onset    Hypertension Other      Other Maternal Grandmother          kidney disease needing HD     Social History     Socioeconomic History    Marital status: Single     Spouse name: Not on file    Number of children: Not on file    Years of education: Not on file    Highest education level: Not on file   Occupational History    Not on file   Tobacco Use    Smoking status: Never    Smokeless tobacco: Never    Tobacco comments:     from friends and relatives    Substance and Sexual Activity    Alcohol use: Not Currently     Comment: Prior heavier use, but completely quit in 2016    Drug use: Yes     Comment: eats edible marijuana for pain and insomnia     Sexual activity: Not on file   Other Topics Concern    Not on file   Social History Narrative      Born in [place], also lived in [place], [place], [place], no travel, worked as a carpenter, occasional cedar, no birds, no hot tubs, worked in construction + possible asbestos exposure      Social Determinants of Health     Financial Resource Strain: Not on file   Food Insecurity: Not on file   Transportation Needs: Not on file   Physical Activity: Not on file   Stress: Not on file   Social Connections: Not on file   Intimate Partner Violence: Not on file   Housing Stability: Not on file     Current Outpatient Medications   Medication Sig    albuterol 108 (90 Base) MCG/ACT inhaler Inhale 2 puffs by mouth every 4 hours as needed for Wheezing or Shortness of Breath.    aspirin 81 MG EC tablet Take 1 tablet (81 mg) by mouth daily.    baclofen (LIORESAL) 10 MG tablet Take 2 tablets (20 mg) by mouth nightly.    Blood Glucose Monitoring Suppl (TRUE METRIX METER) w/Device KIT Use as directed    budesonide-formoterol (SYMBICORT) 160-4.5 MCG/ACT inhaler Inhale 2 puffs by mouth every 12 hours.    bumetanide (BUMEX) 1 MG tablet Take 1 tablet (1 mg) by mouth daily as needed (fluid/weight  gain). Do not take unless instructed by Transplant team.    Calcium Carb-Cholecalciferol 600-10 MG-MCG TABS Take 1 tablet by mouth 2 times daily.    Cetirizine HCl (ZERVIATE) 0.24 % SOLN Place 1 drop into both eyes 2 times daily.    clindamycin (CLEOCIN T) 1 % solution Apply 1 Application. topically 2 times daily. Apply to the red bumps on your face up to two times a day.    controlled substance agreement controlled substance agreement    diclofenac (VOLTAREN) 1 % gel Apply 2 g topically 4 times daily.    docusate sodium (COLACE) 100 MG capsule Take 1 capsule (100 mg) by mouth 2 times daily.    DULoxetine (CYMBALTA) 30 MG CR capsule Take 1 capsule (30 mg) by mouth daily.    famotidine (PEPCID) 20 MG tablet Take 1 tablet (20 mg) by mouth 2 times daily.    fluticasone propionate (FLONASE) 50 MCG/ACT nasal spray Spray 1 spray into each nostril 2 times daily.    gabapentin (NEURONTIN) 300 MG capsule Take 1 capsule (300 mg) by mouth every morning AND 1 capsule (300 mg) daily AND 2 capsules (600 mg) every evening.    hydroCHLOROthiazide (HYDRODIURIL) 25 MG tablet Take 1 tablet (25 mg) by mouth daily.    ketoconazole (NIZORAL) 2 % shampoo Use shampoo daily for dandruff    lidocaine (LIDOCAINE PAIN RELIEF) 4 % patch Apply 1 patch topically every 24 hours. Leave patch on for 12 hours, then remove for 12 hours.    lisinopril (PRINIVIL, ZESTRIL) 10 MG tablet Take 2 tablets (20 mg) by mouth daily.    magnesium oxide (MAG-OX) 400 MG tablet Take 1 tablet by mouth daily    melatonin (GNP MELATONIN MAXIMUM STRENGTH) 5 MG tablet Take 2 tablets (10 mg) by mouth at bedtime.    Multiple Vitamin (MULTIVITAMIN) TABS tablet Take 1 tablet by mouth daily.    naloxone (KLOXXADO) 8 mg/0.1 mL nasal spray Call 911! Tilt head and spray intranasally into one nostril as needed for respiratory depression. If patient does not respond or responds and then relapses, repeat using a new nasal spray every 3 minutes until emergency medical assistance  arrives.    NEEDLE, DISP, 25 G 25G X   1" MISC Use to inject testosterone    NIFEdipine (ADALAT CC) 30 MG Controlled-Release tablet Take 1 tablet (30 mg) by mouth nightly.    ondansetron (ZOFRAN) 8 MG tablet Take 1 tablet (8 mg) by mouth every 8 hours as needed for Nausea/Vomiting.    oxyCODONE (ROXICODONE) 10 MG tablet Take 1 tab every 4 hours as needed for moderate pain and 2 tabs every 4 hours as needed for severe pain. Max 10 tabs per day, 28 day supply    phenazopyridine (PYRIDIUM) 100 MG tablet Take 1 tablet (100 mg) by mouth 3 times daily.    polyethylene glycol (GLYCOLAX) 17 GM/SCOOP powder Mix 17 grams in 4-8 oz of liquide and drink by mouth daily as needed (Constipation).    pravastatin (PRAVACHOL) 40 MG tablet Take 1 tablet (40 mg) by mouth every evening.    senna (SENOKOT) 8.6 MG tablet Take 1 tablet (8.6 mg) by mouth daily.    sirolimus (RAPAMUNE) 1 MG tablet Take 2 tablets (2 mg) by mouth every morning.    SYRINGE-NEEDLE, DISP, 3 ML (B-D 3CC LUER-LOK SYR 25GX1") 25G X 1" 3 ML MISC Use as directed to inject testosterone    SYRINGE-NEEDLE, DISP, 3 ML 18G X 1-1/2" 3 ML MISC Use to draw up testosterone    tacrolimus (ENVARSUS XR) 1 MG tablet STOP TAKING since 11/04/21 - remaining on chart for dose adjustments, titratable med.    tacrolimus (ENVARSUS XR) 4 MG tablet Take 1 tablet (4 mg) by mouth every morning.    tamsulosin (FLOMAX) 0.4 MG capsule Take 1 capsule (0.4 mg) by mouth daily.    tamsulosin (FLOMAX) 0.4 MG capsule Take 1 capsule (0.4 mg) by mouth daily.    testosterone cypionate (DEPO-TESTOSTERONE) 200 MG/ML SOLN Inject 1 ml into the muscle every 14 days    traZODone (DESYREL) 50 MG tablet Take 1 tablet (50 mg) by mouth nightly.     Current Facility-Administered Medications   Medication    diphenhydrAMINE (BENADRYL) injection 50 mg    diphenhydrAMINE (BENADRYL) tablet 50 mg     Immunization History   Administered Date(s) Administered    COVID-19 (Moderna) Low Dose Red Cap >= 18 Years 04/04/2020     COVID-19 (Moderna) Red Cap >= 12 Years 05/12/2019, 06/11/2019, 10/10/2019    Hep-A/Hep-B; Twinrix, Adult 05/05/2020    Influenza Vaccine (High Dose) Quadrivalent >=65 Years 12/26/2019    Influenza Vaccine (Unspecified) 10/23/2016    Influenza Vaccine >=6 Months 01/05/2010, 01/05/2011, 03/02/2012, 11/28/2013, 11/11/2017, 11/27/2018    Pneumococcal 13 Vaccine (PREVNAR-13) 12/26/2019    Pneumococcal 23 Vaccine (PNEUMOVAX-23) 01/05/2013, 05/05/2020    Tdap 02/23/2011   Deferred Date(s) Deferred    Pneumococcal 23 Vaccine (PNEUMOVAX-23) 04/26/2019     Physical Exam:  BP 102/69 (BP Location: Right arm, BP Patient Position: Sitting, BP cuff size: Large)   Pulse 98   Temp 98.5 F (36.9 C) (Temporal)   Resp 16   Ht 5' 10" (1.778 m)   Wt 96.2 kg (212 lb)   SpO2 97%   BMI 30.42 kg/m      General Appearance: ***alert, no distress, pleasant affect, cooperative.  Heart:  JVD ***, PMI ***, normal rate and regular rhythm, no murmurs, clicks, or gallops. ***  Lungs: ***clear to auscultation and percussion. No rales, rhonchi, or wheezes noted. No chest deformities noted.  Abdomen: ***BS normal.  Abdomen soft, non-tender.  No masses or organomegaly.  Extremities:  ***no cyanosis, clubbing, or edema. Has 2+ peripheral pulses.        Lab Data:  Lab Results   Component Value Date    BUN 26 (H) 11/04/2021    CREAT 1.98 (H) 11/04/2021    CL 99 11/04/2021    NA 140 11/04/2021    K 4.4 11/04/2021    CA 9.2 11/04/2021    TBILI 0.47 11/04/2021    ALB 4.1 11/04/2021    TP 7.1 11/04/2021    AST 22 11/04/2021    ALK 76 11/04/2021    BICARB 29 11/04/2021    ALT 25 11/04/2021    GLU 126 (H) 11/04/2021     Lab Results   Component Value Date    WBC 7.9 11/04/2021    RBC 5.50 11/04/2021    HGB 15.2 11/04/2021    HCT 46.5 11/04/2021    MCV 84.5 11/04/2021    MCHC 32.7 11/04/2021    RDW 12.3 11/04/2021    PLT 162 11/04/2021    MPV 11.6 11/04/2021     Lab Results   Component Value Date    A1C 5.7 04/03/2021     Lab Results   Component Value Date     TSH 1.63 04/03/2021     Lab Results   Component Value Date    CHOL 105 04/03/2021    HDL 38 04/03/2021    LDLCALC 43 04/03/2021    TRIG 121 04/03/2021     Lab Results   Component Value Date    SIROT 11.5 11/04/2021     Lab Results   Component Value Date    FKTR 6.5 11/04/2021     No results found for: CSATR  Lab Results   Component Value Date    CMVPL Not Detected 01/23/2021     Lab Results   Component Value Date    DSA ABSENT 11/04/2021       Prior Cardiovascular Studies:   Lab Results   Component Value Date    LV Ejection Fraction 59 04/30/2021          Echo 04/30/21  Summary:   1. The left ventricular size is normal. The left ventricular systolic function is normal.   2. No left ventricular hypertrophy.   3. Normal pattern of left ventricular diastolic filling.   4. EF=59%.   5. Compared to prior study EF now 59%, was 69% 05/23/20.     LHC/IVUS 04/28/21  CONCLUSION:                                                                   1. 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                              2. No angiographic evidence of coronary artery disease.                      3. Intimal thickness noted in LAD/LM up to 0.5 mm (Stable to slightly       worse compare to 2022).                                                         4. Non significant FFR at apical LAD.                                        5. Left ventricular end diastolic pressure appears normal.        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    Assessment/Plan:  # Hematuria  # Dysuria  # Chronic pain  Assessment: We had a long frank discussion about patient's chronic pain issues and the heart transplant team's role in this. I discussed with him that when I initially agreed to cover his chronic opiate prescription, this was the assumption that he would  have a provider versed in chronic pain after 3-4 months, but we are at 6 months and has unable to find one. Additionally, I had not put him on a pain contract at that time, but he recently used more opiates without asking and I informed him this was not appropriate, but because he had not established guidelines I was not going to stop at this time. However, going forward until he can establish with a pain physician, we will set up a pain contract and he will need to follow through like a usual pain clinic with us with goal of provider in 3-4 months or I may start tapering. I will augment adjuvant agents additionally for now and we can continue to work on this.  Plan:  -pain contract signed  -urine tox monthly  -clinic follow up month  -oxycodone 10 mg tablets PO, 1 tab every 4 hours moderate pain, 2 tabs every 4 hours for severe pain, no more than 10 tablets a day, total 280 per 28 days.   -diclofenac cream for joint pain  -lidocaine patch for back pain  -trial of pyridium  -increase gaba at night  -siro change as below  -cymbalta as below    # End-stage heart failure s/p orthotopic heart transplant  # Chronic Immunosuppression/Immunomodulation  Assessment: While we thought continuing sirolimus would help prevent recurrent scar tissue from prostate procedure, it may be exacerbating factors now with delayed wound healing. Will try mmf for 1 month.  Plan:   - continue envarsus 6 mg daily, goal trough 4-8  - HOLD sirolimus 3 mg daily, goal trough 4-8 for at least 1 month  - start mmf 1000 mg bid for one month to allow healing  - Continue to monitor for renal toxicities, infection risk and malignancy risk  - continue pravastatin 40 mg daily  - continue aspirin 81 mg daily    # Hypertension  Assessment: controlled  Plan:  -continue lisinopril 20 mg daily  -resume hctz  -nifedipine 30 mg daily    # Dyslipidemia  -continue pravastatin 40 mg daily    # Depression  Assessment: improved mood  Plan:  -increase cymbalta to 120  mg daily     RTC in 1 month       Nicholas W Wettersten, MD  Advanced Heart Failure, Mechanical Circulatory Support, Transplant  Pgr: [phone]

## 2022-05-19 ENCOUNTER — Encounter: Payer: BC Managed Care – PPO | Admitting: Physician Assistant

## 2022-06-14 ENCOUNTER — Other Ambulatory Visit: Payer: Self-pay | Admitting: Physician Assistant

## 2022-06-14 DIAGNOSIS — F988 Other specified behavioral and emotional disorders with onset usually occurring in childhood and adolescence: Secondary | ICD-10-CM

## 2022-06-15 MED ORDER — AMPHETAMINE-DEXTROAMPHETAMINE 10 MG PO TABS: 10.0000 mg | ORAL_TABLET | Freq: Every day | ORAL | 0 refills | Status: DC

## 2022-06-17 DIAGNOSIS — K219 Gastro-esophageal reflux disease without esophagitis: Secondary | ICD-10-CM | POA: Diagnosis not present

## 2022-06-17 DIAGNOSIS — E785 Hyperlipidemia, unspecified: Secondary | ICD-10-CM | POA: Diagnosis not present

## 2022-06-18 LAB — CBC WITH DIFFERENTIAL/PLATELET
Basophils Absolute: 0.1 10*3/uL (ref 0.0–0.2)
Basos: 1 %
EOS (ABSOLUTE): 0.2 10*3/uL (ref 0.0–0.4)
Eos: 4 %
Hematocrit: 42.3 % (ref 34.0–46.6)
Hemoglobin: 14.4 g/dL (ref 11.1–15.9)
Immature Grans (Abs): 0 10*3/uL (ref 0.0–0.1)
Immature Granulocytes: 0 %
Lymphocytes Absolute: 1.9 10*3/uL (ref 0.7–3.1)
Lymphs: 37 %
MCH: 32.1 pg (ref 26.6–33.0)
MCHC: 34 g/dL (ref 31.5–35.7)
MCV: 94 fL (ref 79–97)
Monocytes Absolute: 0.5 10*3/uL (ref 0.1–0.9)
Monocytes: 9 %
Neutrophils Absolute: 2.4 10*3/uL (ref 1.4–7.0)
Neutrophils: 49 %
Platelets: 214 10*3/uL (ref 150–450)
RBC: 4.49 x10E6/uL (ref 3.77–5.28)
RDW: 12.3 % (ref 11.7–15.4)
WBC: 5 10*3/uL (ref 3.4–10.8)

## 2022-06-18 LAB — LIPID PANEL
Chol/HDL Ratio: 3.7 ratio (ref 0.0–4.4)
Cholesterol, Total: 220 mg/dL — ABNORMAL HIGH (ref 100–199)
HDL: 59 mg/dL (ref >39)
LDL Chol Calc (NIH): 145 mg/dL — ABNORMAL HIGH (ref 0–99)
Triglycerides: 91 mg/dL (ref 0–149)
VLDL Cholesterol Cal: 16 mg/dL (ref 5–40)

## 2022-06-18 LAB — COMPREHENSIVE METABOLIC PANEL
ALT: 13 IU/L (ref 0–32)
AST: 17 IU/L (ref 0–40)
Albumin/Globulin Ratio: 2.1 (ref 1.2–2.2)
Albumin: 4.4 g/dL (ref 3.9–4.9)
Alkaline Phosphatase: 55 IU/L (ref 44–121)
BUN/Creatinine Ratio: 20 (ref 12–28)
BUN: 18 mg/dL (ref 8–27)
Bilirubin Total: 0.4 mg/dL (ref 0.0–1.2)
CO2: 22 mmol/L (ref 20–29)
Calcium: 9.2 mg/dL (ref 8.7–10.3)
Chloride: 104 mmol/L (ref 96–106)
Creatinine, Ser: 0.89 mg/dL (ref 0.57–1.00)
Globulin, Total: 2.1 g/dL (ref 1.5–4.5)
Glucose: 84 mg/dL (ref 70–99)
Potassium: 4.9 mmol/L (ref 3.5–5.2)
Sodium: 139 mmol/L (ref 134–144)
Total Protein: 6.5 g/dL (ref 6.0–8.5)
eGFR: 73 mL/min/{1.73_m2} (ref >59)

## 2022-06-23 ENCOUNTER — Encounter: Payer: Self-pay | Admitting: Physician Assistant

## 2022-06-23 NOTE — Progress Notes (Unsigned)
I,Sha'taria Tyson,acting as a Neurosurgeon for [employer], PA-C.,have documented all relevant documentation on the behalf of Alfredia Ferguson, PA-C,as directed by  Alfredia Ferguson, PA-C while in the presence of Alfredia Ferguson, PA-C.   Complete physical exam   Patient: Kathy Barnes   DOB: 1960-03-25   62 y.o. Female  MRN: [id number] Visit Date: 06/24/2022  Today's healthcare provider: Alfredia Ferguson, PA-C   No chief complaint on file.  Subjective    Kathy Barnes is a 62 y.o. female who presents today for a complete physical exam.  She reports consuming a {diet types:17450} diet. {Exercise:19826} She generally feels {well/fairly well/poorly:18703}. She reports sleeping {well/fairly well/poorly:18703}. She {does/does not:200015} have additional problems to discuss today.  HPI  ***  Past Medical History:  Diagnosis Date   Abnormal uterine bleeding    Anxiety    manage without medications.   Fibromyalgia    GERD (gastroesophageal reflux disease)    History of uterine fibroid 2011   Psoriasis    Vertigo    last episode approx 12 yrs ago   Past Surgical History:  Procedure Laterality Date   ANKLE SURGERY     BIOPSY ENDOMETRIAL     CHOLECYSTECTOMY     COLONOSCOPY WITH PROPOFOL N/A 12/13/2014   Procedure: COLONOSCOPY WITH PROPOFOL;  Surgeon: Midge Minium, MD;  Location: [facility];  Service: Endoscopy;  Laterality: N/A;   COLONOSCOPY WITH PROPOFOL N/A 05/13/2021   Procedure: COLONOSCOPY WITH PROPOFOL;  Surgeon: Toney Reil, MD;  Location: [facility];  Service: Gastroenterology;  Laterality: N/A;   DILATION AND CURETTAGE OF UTERUS     ESOPHAGOGASTRODUODENOSCOPY (EGD) WITH PROPOFOL N/A 12/13/2014   Procedure: ESOPHAGOGASTRODUODENOSCOPY (EGD) WITH PROPOFOL;  Surgeon: Midge Minium, MD;  Location: [facility];  Service: Endoscopy;  Laterality: N/A;   FOOT SURGERY Left    HERNIA REPAIR     POLYPECTOMY  12/13/2014   Procedure: POLYPECTOMY;  Surgeon: Midge Minium, MD;  Location: [facility];  Service: Endoscopy;;   TONSILLECTOMY     TUBAL LIGATION     WISDOM TOOTH EXTRACTION     x4   Social History   Socioeconomic History   Marital status: Married    Spouse name: Not on file   Number of children: Not on file   Years of education: Not on file   Highest education level: Not on file  Occupational History   Not on file  Tobacco Use   Smoking status: Never   Smokeless tobacco: Never  Vaping Use   Vaping Use: Never used  Substance and Sexual Activity   Alcohol use: No   Drug use: No   Sexual activity: Yes    Partners: Male    Birth control/protection: Post-menopausal  Other Topics Concern   Not on file  Social History Narrative   Not on file   Social Determinants of Health   Financial Resource Strain: Not on file  Food Insecurity: Not on file  Transportation Needs: Not on file  Physical Activity: Not on file  Stress: Not on file  Social Connections: Not on file  Intimate Partner Violence: Not on file   Family Status  Relation Name Status   Mother  Alive   Father  Alive   MGM  Deceased   MGF  Deceased   PGM  Deceased   PGF  Deceased   Sister  Alive   Brother  Alive   Daughter  Alive   Son  Alive   Brother  Alive   Daughter  Alive   Family History  Problem Relation Age of Onset   Hypertension Mother    Arthritis Mother    Hypertension Father    Asthma Father    Hyperlipidemia Father    Diabetes Maternal Grandmother    Cancer Maternal Grandmother        kidney   Stroke Maternal Grandfather    Diabetes Paternal Grandmother    Heart disease Paternal Grandfather    ADD / ADHD Son    Hypertension Brother    Allergies  Allergen Reactions   Ciprofloxacin     Leg pain    Sulfa Antibiotics Other (See Comments)    Patient Care Team: Alfredia Ferguson, PA-C as PCP - General (Physician Assistant) Olivia Mackie, MD as Consulting Physician (Obstetrics and Gynecology)   Medications: Outpatient  Medications Prior to Visit  Medication Sig   amphetamine-dextroamphetamine (ADDERALL) 10 MG tablet Take 1 tablet (10 mg total) by mouth daily.   benzonatate (TESSALON) 200 MG capsule Take 1 capsule (200 mg total) by mouth 2 (two) times daily as needed for cough.   estradiol (ESTRACE) 0.1 MG/GM vaginal cream estradiol 0.01% (0.1 mg/gram) vaginal cream   MULTIPLE VITAMIN PO Take by mouth.   omeprazole (PRILOSEC) 20 MG capsule Take 1 capsule (20 mg total) by mouth daily. Take on an empty stomach 20 minutes before the first meal or other medicaitons   No facility-administered medications prior to visit.    Review of Systems  {Labs  Heme  Chem  Endocrine  Serology  Results Review (optional):23779}  Objective    LMP 07/23/2009  {Show previous vital signs (optional):23777}   Physical Exam  ***  Last depression screening scores    03/30/2022    1:52 PM 11/03/2021    3:27 PM 08/16/2018    8:48 AM  PHQ 2/9 Scores  PHQ - 2 Score 0 0 0  PHQ- 9 Score 2 3 1    Last fall risk screening    03/30/2022    1:51 PM  Fall Risk   Falls in the past year? 0  Number falls in past yr: 0  Injury with Fall? 0  Follow up Falls evaluation completed   Last Audit-C alcohol use screening    03/30/2022    1:51 PM  Alcohol Use Disorder Test (AUDIT)  1. How often do you have a drink containing alcohol? 0  2. How many drinks containing alcohol do you have on a typical day when you are drinking? 0  3. How often do you have six or more drinks on one occasion? 0  AUDIT-C Score 0   A score of 3 or more in women, and 4 or more in men indicates increased risk for alcohol abuse, EXCEPT if all of the points are from question 1   No results found for any visits on 06/24/22.  Assessment & Plan    Routine Health Maintenance and Physical Exam  Exercise Activities and Dietary recommendations  Goals   None     Immunization History  Administered Date(s) Administered   Influenza Split 01/22/2006,  12/23/2011   Influenza Whole 11/15/2015   Influenza,inj,Quad PF,6+ Mos 11/27/2013, 12/26/2017, 01/12/2022   Influenza-Unspecified 01/12/2020, 12/01/2020   Moderna Sars-Covid-2 Vaccination 03/07/2019, 04/04/2019, 03/07/2020   Td 08/07/2004, 08/13/2010   Tdap 10/22/2010   Unspecified SARS-COV-2 Vaccination 04/08/2020   Zoster Recombinat (Shingrix) 12/19/2020, 01/12/2022    Health Maintenance  Topic Date Due   HIV Screening  Never done   Hepatitis C  Screening  Never done   PAP SMEAR-Modifier  05/02/2017   DTaP/Tdap/Td (4 - Td or Tdap) 10/21/2020   COVID-19 Vaccine (5 - 2023-24 season) 10/23/2021   MAMMOGRAM  06/24/2022   INFLUENZA VACCINE  09/23/2022   COLONOSCOPY (Pts 45-71yrs Insurance coverage will need to be confirmed)  05/14/2031   Zoster Vaccines- Shingrix  Completed   HPV VACCINES  Aged Out    Discussed health benefits of physical activity, and encouraged her to engage in regular exercise appropriate for her age and condition.  ***  No follow-ups on file.     {provider attestation***:1}   Alfredia Ferguson, PA-C  [facility] [phone] (phone) [phone] (fax)  [facility]

## 2022-06-24 ENCOUNTER — Encounter: Payer: Self-pay | Admitting: Physician Assistant

## 2022-06-24 ENCOUNTER — Ambulatory Visit (INDEPENDENT_AMBULATORY_CARE_PROVIDER_SITE_OTHER): Payer: BC Managed Care – PPO | Admitting: Physician Assistant

## 2022-06-24 VITALS — BP 123/88 | HR 72 | Ht 62.0 in | Wt 135.0 lb

## 2022-06-24 DIAGNOSIS — F988 Other specified behavioral and emotional disorders with onset usually occurring in childhood and adolescence: Secondary | ICD-10-CM | POA: Diagnosis not present

## 2022-06-24 DIAGNOSIS — E785 Hyperlipidemia, unspecified: Secondary | ICD-10-CM | POA: Diagnosis not present

## 2022-06-24 DIAGNOSIS — Z0000 Encounter for general adult medical examination without abnormal findings: Secondary | ICD-10-CM

## 2022-06-24 NOTE — Assessment & Plan Note (Signed)
Stable on medication Ordered drug screen today Pt reports last dose was yesterday

## 2022-06-24 NOTE — Assessment & Plan Note (Signed)
Reviewed labs she had from the holistic pharmacist Given family history, elevated LDL, and these labs, mutual decision to do a CT calcium score. Overall excellent lifestyle changes and habits that are protective and preventative

## 2022-06-25 LAB — PAIN MGT SCRN (14 DRUGS), UR
Amphetamine Scrn, Ur: NEGATIVE ng/mL
BARBITURATE SCREEN URINE: NEGATIVE ng/mL
BENZODIAZEPINE SCREEN, URINE: NEGATIVE ng/mL
Buprenorphine, Urine: NEGATIVE ng/mL
CANNABINOIDS UR QL SCN: NEGATIVE ng/mL
Cocaine (Metab) Scrn, Ur: NEGATIVE ng/mL
Creatinine(Crt), U: 37.6 mg/dL (ref 20.0–300.0)
Fentanyl, Urine: NEGATIVE pg/mL
Meperidine Screen, Urine: NEGATIVE ng/mL
Methadone Screen, Urine: NEGATIVE ng/mL
OXYCODONE+OXYMORPHONE UR QL SCN: NEGATIVE ng/mL
Opiate Scrn, Ur: NEGATIVE ng/mL
Ph of Urine: 5.6 (ref 4.5–8.9)
Phencyclidine Qn, Ur: NEGATIVE ng/mL
Propoxyphene Scrn, Ur: NEGATIVE ng/mL
Tramadol Screen, Urine: NEGATIVE ng/mL

## 2022-06-30 ENCOUNTER — Ambulatory Visit
Admission: RE | Admit: 2022-06-30 | Discharge: 2022-06-30 | Disposition: A | Discharge status: Home / Self Care | Payer: BC Managed Care – PPO | Source: Ambulatory Visit | Attending: Physician Assistant | Admitting: Physician Assistant

## 2022-06-30 DIAGNOSIS — E785 Hyperlipidemia, unspecified: Secondary | ICD-10-CM | POA: Insufficient documentation

## 2022-07-02 ENCOUNTER — Other Ambulatory Visit: Payer: BC Managed Care – PPO

## 2022-07-20 DIAGNOSIS — L858 Other specified epidermal thickening: Secondary | ICD-10-CM | POA: Diagnosis not present

## 2022-07-20 DIAGNOSIS — L821 Other seborrheic keratosis: Secondary | ICD-10-CM | POA: Diagnosis not present

## 2022-07-20 DIAGNOSIS — L900 Lichen sclerosus et atrophicus: Secondary | ICD-10-CM | POA: Diagnosis not present

## 2022-07-20 DIAGNOSIS — D2371 Other benign neoplasm of skin of right lower limb, including hip: Secondary | ICD-10-CM | POA: Diagnosis not present

## 2022-07-21 NOTE — Progress Notes (Signed)
Patient last pap smear abstracted

## 2022-07-30 ENCOUNTER — Other Ambulatory Visit: Payer: Self-pay | Admitting: Physician Assistant

## 2022-07-30 DIAGNOSIS — F988 Other specified behavioral and emotional disorders with onset usually occurring in childhood and adolescence: Secondary | ICD-10-CM

## 2022-07-30 MED ORDER — AMPHETAMINE-DEXTROAMPHETAMINE 10 MG PO TABS
10.0000 mg | ORAL_TABLET | Freq: Every day | ORAL | 0 refills | Status: DC
Start: 2022-07-30 — End: 2022-09-24

## 2022-09-08 ENCOUNTER — Ambulatory Visit (INDEPENDENT_AMBULATORY_CARE_PROVIDER_SITE_OTHER): Payer: BC Managed Care – PPO | Admitting: Family Medicine

## 2022-09-08 ENCOUNTER — Encounter: Payer: Self-pay | Admitting: Family Medicine

## 2022-09-08 VITALS — BP 132/91 | HR 87 | Temp 97.9°F | Ht 62.0 in | Wt 133.0 lb

## 2022-09-08 DIAGNOSIS — J019 Acute sinusitis, unspecified: Secondary | ICD-10-CM

## 2022-09-08 DIAGNOSIS — B9789 Other viral agents as the cause of diseases classified elsewhere: Secondary | ICD-10-CM

## 2022-09-08 MED ORDER — AMOXICILLIN-POT CLAVULANATE 875-125 MG PO TABS: 1.0000 | ORAL_TABLET | Freq: Two times a day (BID) | ORAL | 0 refills | Status: AC

## 2022-09-08 NOTE — Progress Notes (Signed)
Established patient visit   Patient: Kathy Barnes   DOB: 1960/11/27   62 y.o. Female  MRN: [id number] Visit Date: 09/08/2022  Today's healthcare provider: Sherlyn Hay, DO   Chief Complaint  Patient presents with   URI    Was with her grandchildren last week and for the last week she has had symptoms of PND, some cough, hoarseness, left ear pain and feels her glands are swollen.  She said that Sudafed helps but she has trouble sleeping if she takes it too late.   Subjective    URI  Associated symptoms include congestion, coughing, ear pain (left), rhinorrhea and a sore throat (scratchy). Pertinent negatives include no abdominal pain, chest pain or wheezing.   HPI     URI   Associated symptoms inlclude congestion, ear pain, plugged ear sensation, sinus pain and sore throat. Additional comments: Was with her grandchildren last week and for the last week she has had symptoms of PND, some cough, hoarseness, left ear pain and feels her glands are swollen.  She said that Sudafed helps but she has trouble sleeping if she takes it too late.      Last edited by Adline Peals, CMA on 09/08/2022 10:50 AM.       Mora Appl last Thursday Little bit of cough (more often at night but sometimes during the day) and nose drainage Headache, worse on left side of face and in left ear No known fever/chills Hasn't slowed her down much just coughing when going to bed at night, then ear pain  - scratchy throat; lays on left side, which is the side that predominantly seems scratchy Two grandkids had some symptoms - one was getting over bit of a runny nose and the two year old was a little hoarse and didn't feel very good.  Has been taking sudafed in the morning, which helps her head. Doesn't take after noon because it keeps her awake.  - Tylenol at night    Medications: Outpatient Medications Prior to Visit  Medication Sig   amphetamine-dextroamphetamine (ADDERALL) 10 MG tablet Take 1  tablet (10 mg total) by mouth daily.   estradiol (ESTRACE) 0.1 MG/GM vaginal cream estradiol 0.01% (0.1 mg/gram) vaginal cream   MULTIPLE VITAMIN PO Take by mouth.   No facility-administered medications prior to visit.    Review of Systems  Constitutional:  Negative for activity change, chills, fatigue and fever.  HENT:  Positive for congestion, ear pain (left), postnasal drip, rhinorrhea, sinus pressure and sore throat (scratchy). Negative for trouble swallowing.   Respiratory:  Positive for cough. Negative for chest tightness, shortness of breath and wheezing.   Cardiovascular:  Negative for chest pain.  Gastrointestinal:  Negative for abdominal pain.  Skin:  Negative for color change.  Neurological:  Negative for dizziness and light-headedness.         Objective    BP (!) 132/91 (BP Location: Right Arm, Patient Position: Sitting, Cuff Size: Normal)   Pulse 87   Temp 97.9 F (36.6 C) (Oral)   Ht 5\' 2"  (1.575 m)   Wt 133 lb (60.3 kg)   LMP 07/23/2009   SpO2 96%   BMI 24.33 kg/m      Physical Exam Vitals reviewed.  Constitutional:      General: She is not in acute distress.    Appearance: Normal appearance. She is well-developed. She is not diaphoretic.  HENT:     Head: Normocephalic and atraumatic.  Right Ear: Tympanic membrane, ear canal and external ear normal.     Left Ear: Tympanic membrane, ear canal and external ear normal.     Nose: Nose normal.     Mouth/Throat:     Mouth: Mucous membranes are moist.     Pharynx: Oropharynx is clear. No oropharyngeal exudate or posterior oropharyngeal erythema.  Eyes:     General: No scleral icterus.    Conjunctiva/sclera: Conjunctivae normal.     Pupils: Pupils are equal, round, and reactive to light.  Cardiovascular:     Rate and Rhythm: Normal rate and regular rhythm.     Pulses: Normal pulses.     Heart sounds: Normal heart sounds. No murmur heard. Pulmonary:     Effort: Pulmonary effort is normal. No  respiratory distress.     Breath sounds: Normal breath sounds. No wheezing or rales.  Musculoskeletal:     Cervical back: Neck supple.     Right lower leg: No edema.     Left lower leg: No edema.  Lymphadenopathy:     Cervical: No cervical adenopathy.  Skin:    General: Skin is warm and dry.     Findings: No rash.  Neurological:     Mental Status: She is alert.      No results found for any visits on 09/08/22.  Assessment & Plan     1. Acute viral sinusitis Counseled regarding signs and symptoms of viral and bacterial respiratory infections. Advised her that she can start prescription for antibiotic if she develops any sign of bacterial infection, or if current symptoms last through Saturday.   - amoxicillin-clavulanate (AUGMENTIN) 875-125 MG tablet; Take 1 tablet by mouth 2 (two) times daily for 7 days.  Dispense: 14 tablet; Refill: 0   No follow-ups on file.      The entirety of the information documented in the History of Present Illness, Review of Systems and Physical Exam were personally obtained by me. Portions of this information were initially documented by the CMA, Adline Peals, and reviewed by me for thoroughness and accuracy.   I discussed the assessment and treatment plan with the patient  The patient was provided an opportunity to ask questions and all were answered. The patient agreed with the plan and demonstrated an understanding of the instructions.   The patient was advised to call back or seek an in-person evaluation if the symptoms worsen or if the condition fails to improve as anticipated.    Sherlyn Hay, DO  [facility] [phone] (phone) [phone] (fax)  [facility]

## 2022-09-08 NOTE — Patient Instructions (Addendum)
Drink plenty of water! And try to rest.  Flonase is available over the counter (fluticasone propionate). Can consider an allergy medication every night before bed.  Augmentin will be available at your pharmacy. Please wait until Saturday to consider getting and starting this antibiotic (or if you worsen acutely before then)

## 2022-09-22 ENCOUNTER — Telehealth: Payer: BC Managed Care – PPO | Admitting: Nurse Practitioner

## 2022-09-22 DIAGNOSIS — J0140 Acute pansinusitis, unspecified: Secondary | ICD-10-CM

## 2022-09-22 MED ORDER — FLUTICASONE PROPIONATE 50 MCG/ACT NA SUSP
2.0000 | Freq: Every day | NASAL | 6 refills | Status: DC
Start: 2022-09-22 — End: 2023-07-27

## 2022-09-22 MED ORDER — DOXYCYCLINE HYCLATE 100 MG PO TABS
100.0000 mg | ORAL_TABLET | Freq: Two times a day (BID) | ORAL | 0 refills | Status: AC
Start: 2022-09-22 — End: 2022-10-02

## 2022-09-22 NOTE — Progress Notes (Signed)
E-Visit for Sinus Problems  We are sorry that you are not feeling well.  Here is how we plan to help!  Based on what you have shared with me it looks like you have sinusitis.  Sinusitis is inflammation and infection in the sinus cavities of the head.  Based on your presentation I believe you most likely have Acute Bacterial Sinusitis.  This is an infection caused by bacteria and is treated with antibiotics. I have prescribed Doxycycline 100mg  by mouth twice a day for 10 days.Please note this antibiotic may make you more sensitive to the sun. We will also prescribe a nasal spray for added support.   You may use an oral decongestant such as Mucinex D or if you have glaucoma or high blood pressure use plain Mucinex. Saline nasal spray help and can safely be used as often as needed for congestion.  If you develop worsening sinus pain, fever or notice severe headache and vision changes, or if symptoms are not better after completion of antibiotic, please schedule an appointment with a health care provider.    Sinus infections are not as easily transmitted as other respiratory infection, however we still recommend that you avoid close contact with loved ones, especially the very young and elderly.  Remember to wash your hands thoroughly throughout the day as this is the number one way to prevent the spread of infection!  Home Care: Only take medications as instructed by your medical team. Complete the entire course of an antibiotic. Do not take these medications with alcohol. A steam or ultrasonic humidifier can help congestion.  You can place a towel over your head and breathe in the steam from hot water coming from a faucet. Avoid close contacts especially the very young and the elderly. Cover your mouth when you cough or sneeze. Always remember to wash your hands.  Get Help Right Away If: You develop worsening fever or sinus pain. You develop a severe head ache or visual changes. Your symptoms  persist after you have completed your treatment plan.  Make sure you Understand these instructions. Will watch your condition. Will get help right away if you are not doing well or get worse.  Thank you for choosing an e-visit.  Your e-visit answers were reviewed by a board certified advanced clinical practitioner to complete your personal care plan. Depending upon the condition, your plan could have included both over the counter or prescription medications.  Please review your pharmacy choice. Make sure the pharmacy is open so you can pick up prescription now. If there is a problem, you may contact your provider through [employer] and have the prescription routed to another pharmacy.  Your safety is important to [place]. If you have drug allergies check your prescription carefully.   For the next 24 hours you can use MyChart to ask questions about today's visit, request a non-urgent call back, or ask for a work or school excuse. You will get an email in the next two days asking about your experience. I hope that your e-visit has been valuable and will speed your recovery.   Meds ordered this encounter  Medications   doxycycline (VIBRA-TABS) 100 MG tablet    Sig: Take 1 tablet (100 mg total) by mouth 2 (two) times daily for 10 days.    Dispense:  20 tablet    Refill:  0   fluticasone (FLONASE) 50 MCG/ACT nasal spray    Sig: Place 2 sprays into both nostrils daily.  Dispense:  16 g    Refill:  6    I spent approximately 5 minutes reviewing the patient's history, current symptoms and coordinating their care today.

## 2022-09-24 ENCOUNTER — Other Ambulatory Visit: Payer: Self-pay | Admitting: Physician Assistant

## 2022-09-24 DIAGNOSIS — F988 Other specified behavioral and emotional disorders with onset usually occurring in childhood and adolescence: Secondary | ICD-10-CM

## 2022-09-26 MED ORDER — AMPHETAMINE-DEXTROAMPHETAMINE 10 MG PO TABS
10.0000 mg | ORAL_TABLET | Freq: Every day | ORAL | 0 refills | Status: DC
Start: 2022-09-26 — End: 2022-11-16

## 2022-10-12 DIAGNOSIS — Z124 Encounter for screening for malignant neoplasm of cervix: Secondary | ICD-10-CM | POA: Diagnosis not present

## 2022-10-12 DIAGNOSIS — Z01419 Encounter for gynecological examination (general) (routine) without abnormal findings: Secondary | ICD-10-CM | POA: Diagnosis not present

## 2022-10-12 DIAGNOSIS — Z1231 Encounter for screening mammogram for malignant neoplasm of breast: Secondary | ICD-10-CM | POA: Diagnosis not present

## 2022-10-12 LAB — HM MAMMOGRAPHY

## 2022-10-13 ENCOUNTER — Other Ambulatory Visit: Payer: Self-pay | Admitting: Obstetrics and Gynecology

## 2022-10-13 ENCOUNTER — Encounter: Payer: Self-pay | Admitting: Obstetrics and Gynecology

## 2022-10-13 DIAGNOSIS — N951 Menopausal and female climacteric states: Secondary | ICD-10-CM

## 2022-11-16 ENCOUNTER — Other Ambulatory Visit: Payer: Self-pay | Admitting: Family Medicine

## 2022-11-16 DIAGNOSIS — F988 Other specified behavioral and emotional disorders with onset usually occurring in childhood and adolescence: Secondary | ICD-10-CM

## 2022-11-17 MED ORDER — AMPHETAMINE-DEXTROAMPHETAMINE 10 MG PO TABS
10.0000 mg | ORAL_TABLET | Freq: Every day | ORAL | 0 refills | Status: DC
Start: 2022-11-17 — End: 2022-12-27

## 2022-12-20 ENCOUNTER — Encounter: Payer: Self-pay | Admitting: Physician Assistant

## 2022-12-22 ENCOUNTER — Other Ambulatory Visit: Payer: Self-pay | Admitting: Family Medicine

## 2022-12-22 DIAGNOSIS — F988 Other specified behavioral and emotional disorders with onset usually occurring in childhood and adolescence: Secondary | ICD-10-CM

## 2022-12-27 ENCOUNTER — Ambulatory Visit (INDEPENDENT_AMBULATORY_CARE_PROVIDER_SITE_OTHER): Payer: BC Managed Care – PPO | Admitting: Family Medicine

## 2022-12-27 VITALS — BP 124/80 | HR 82 | Ht 61.0 in | Wt 142.3 lb

## 2022-12-27 DIAGNOSIS — F988 Other specified behavioral and emotional disorders with onset usually occurring in childhood and adolescence: Secondary | ICD-10-CM | POA: Diagnosis not present

## 2022-12-27 DIAGNOSIS — E785 Hyperlipidemia, unspecified: Secondary | ICD-10-CM | POA: Diagnosis not present

## 2022-12-27 MED ORDER — AMPHETAMINE-DEXTROAMPHETAMINE 10 MG PO TABS
10.0000 mg | ORAL_TABLET | Freq: Every day | ORAL | 0 refills | Status: DC
Start: 2022-12-27 — End: 2023-02-12

## 2022-12-27 NOTE — Assessment & Plan Note (Signed)
Manages with PRN adderall 10 mg, takes ~3 days a week. Chronic + stable, refilled

## 2022-12-27 NOTE — Progress Notes (Signed)
Established patient visit   Patient: Kathy Barnes   DOB: 1960-05-31   62 y.o. Female  MRN: [id number] Visit Date: 12/27/2022  Today's healthcare provider: Sherlyn Hay, DO   No chief complaint on file.  Subjective    HPI Labs up-to-date 06/17/2022 UDS 06/24/2022: Pan-negative Last annual physical 06/24/2022  ADHD follow-up  - takes on days she sees clients (3-4 days per week)  - takes early in the morning  - wakes at 0400, goes to the gym at 0600.     Medications: Outpatient Medications Prior to Visit  Medication Sig   fluticasone (FLONASE) 50 MCG/ACT nasal spray Place 2 sprays into both nostrils daily.   MULTIPLE VITAMIN PO Take by mouth.   [DISCONTINUED] amphetamine-dextroamphetamine (ADDERALL) 10 MG tablet Take 1 tablet (10 mg total) by mouth daily.   [DISCONTINUED] estradiol (ESTRACE) 0.1 MG/GM vaginal cream estradiol 0.01% (0.1 mg/gram) vaginal cream   clobetasol ointment (TEMOVATE) 0.05 % Apply 1 Application topically daily.   No facility-administered medications prior to visit.    Review of Systems  Genitourinary:  Negative for dyspareunia, dysuria, hematuria, pelvic pain, urgency, vaginal bleeding and vaginal pain (no itching as well).  Skin:  Positive for color change (vulva; on clobetasol).  Psychiatric/Behavioral:  Negative for decreased concentration (well-controlled with Adderall) and dysphoric mood. The patient is not nervous/anxious.          Objective    BP 124/80 (BP Location: Left Arm, Patient Position: Sitting, Cuff Size: Normal)   Pulse 82   Ht 5\' 1"  (1.549 m)   Wt 142 lb 4.8 oz (64.5 kg)   LMP 07/23/2009   SpO2 98%   BMI 26.89 kg/m     Physical Exam Vitals and nursing note reviewed.  Constitutional:      General: She is not in acute distress.    Appearance: Normal appearance.  HENT:     Head: Normocephalic and atraumatic.  Eyes:     General: No scleral icterus.    Conjunctiva/sclera: Conjunctivae normal.  Cardiovascular:      Rate and Rhythm: Normal rate.  Pulmonary:     Effort: Pulmonary effort is normal. No respiratory distress.  Neurological:     Mental Status: She is alert and oriented to person, place, and time. Mental status is at baseline.  Psychiatric:        Mood and Affect: Mood normal.        Behavior: Behavior normal.      No results found for any visits on 12/27/22.  Assessment & Plan    Attention deficit disorder (ADD) without hyperactivity Assessment & Plan: Manages with PRN adderall 10 mg, takes ~3 days a week. Chronic + stable, refilled  Orders: -     Amphetamine-Dextroamphetamine; Take 1 tablet (10 mg total) by mouth daily.  Dispense: 30 tablet; Refill: 0  Hyperlipidemia, unspecified hyperlipidemia type Assessment & Plan: Ca cardiac score 0 earlier this year Controlled with diet and exercise The 10-year ASCVD risk score (Arnett DK, et al., 2019) is: 3.9%    Return in about 6 months (around 06/26/2023) for CPE.      I discussed the assessment and treatment plan with the patient  The patient was provided an opportunity to ask questions and all were answered. The patient agreed with the plan and demonstrated an understanding of the instructions.   The patient was advised to call back or seek an in-person evaluation if the symptoms worsen or if the condition fails to improve  as anticipated.    Sherlyn Hay, DO  [facility] [phone] (phone) [phone] (fax)  [facility]

## 2022-12-27 NOTE — Assessment & Plan Note (Signed)
Ca cardiac score 0 earlier this year Controlled with diet and exercise The 10-year ASCVD risk score (Arnett DK, et al., 2019) is: 3.9%

## 2023-01-25 DIAGNOSIS — Z131 Encounter for screening for diabetes mellitus: Secondary | ICD-10-CM | POA: Diagnosis not present

## 2023-01-25 DIAGNOSIS — E559 Vitamin D deficiency, unspecified: Secondary | ICD-10-CM | POA: Diagnosis not present

## 2023-01-25 DIAGNOSIS — F900 Attention-deficit hyperactivity disorder, predominantly inattentive type: Secondary | ICD-10-CM | POA: Diagnosis not present

## 2023-01-25 DIAGNOSIS — N951 Menopausal and female climacteric states: Secondary | ICD-10-CM | POA: Diagnosis not present

## 2023-01-25 DIAGNOSIS — E7800 Pure hypercholesterolemia, unspecified: Secondary | ICD-10-CM | POA: Diagnosis not present

## 2023-01-25 DIAGNOSIS — Z6826 Body mass index (BMI) 26.0-26.9, adult: Secondary | ICD-10-CM | POA: Diagnosis not present

## 2023-01-25 DIAGNOSIS — R7989 Other specified abnormal findings of blood chemistry: Secondary | ICD-10-CM | POA: Diagnosis not present

## 2023-02-01 DIAGNOSIS — E663 Overweight: Secondary | ICD-10-CM | POA: Diagnosis not present

## 2023-02-01 DIAGNOSIS — Z1331 Encounter for screening for depression: Secondary | ICD-10-CM | POA: Diagnosis not present

## 2023-02-01 DIAGNOSIS — R635 Abnormal weight gain: Secondary | ICD-10-CM | POA: Diagnosis not present

## 2023-02-01 DIAGNOSIS — N951 Menopausal and female climacteric states: Secondary | ICD-10-CM | POA: Diagnosis not present

## 2023-02-10 DIAGNOSIS — Z6827 Body mass index (BMI) 27.0-27.9, adult: Secondary | ICD-10-CM | POA: Diagnosis not present

## 2023-02-10 DIAGNOSIS — E7849 Other hyperlipidemia: Secondary | ICD-10-CM | POA: Diagnosis not present

## 2023-02-10 DIAGNOSIS — R7989 Other specified abnormal findings of blood chemistry: Secondary | ICD-10-CM | POA: Diagnosis not present

## 2023-02-12 ENCOUNTER — Other Ambulatory Visit: Payer: Self-pay | Admitting: Family Medicine

## 2023-02-12 DIAGNOSIS — F988 Other specified behavioral and emotional disorders with onset usually occurring in childhood and adolescence: Secondary | ICD-10-CM

## 2023-02-17 DIAGNOSIS — E559 Vitamin D deficiency, unspecified: Secondary | ICD-10-CM | POA: Diagnosis not present

## 2023-02-17 DIAGNOSIS — Z6827 Body mass index (BMI) 27.0-27.9, adult: Secondary | ICD-10-CM | POA: Diagnosis not present

## 2023-02-17 MED ORDER — AMPHETAMINE-DEXTROAMPHETAMINE 10 MG PO TABS
10.0000 mg | ORAL_TABLET | Freq: Every day | ORAL | 0 refills | Status: DC
Start: 2023-02-17 — End: 2023-06-09

## 2023-03-18 ENCOUNTER — Other Ambulatory Visit: Payer: Self-pay | Admitting: Obstetrics and Gynecology

## 2023-03-18 DIAGNOSIS — E2839 Other primary ovarian failure: Secondary | ICD-10-CM

## 2023-04-20 DIAGNOSIS — H43813 Vitreous degeneration, bilateral: Secondary | ICD-10-CM | POA: Diagnosis not present

## 2023-04-20 DIAGNOSIS — H2513 Age-related nuclear cataract, bilateral: Secondary | ICD-10-CM | POA: Diagnosis not present

## 2023-05-05 IMAGING — CR DG ABDOMEN 1V
1 series · 2 of 2 positions shown · non-contrast
Comparison: None Available.

CLINICAL DATA: Left lower quadrant pain.  Prior colonoscopy.

EXAM:
ABDOMEN - 1 VIEW

[Series 1: dg abd 1 view · 0.14mm/px · 2 of 2 slices shown]
[im 1/2]
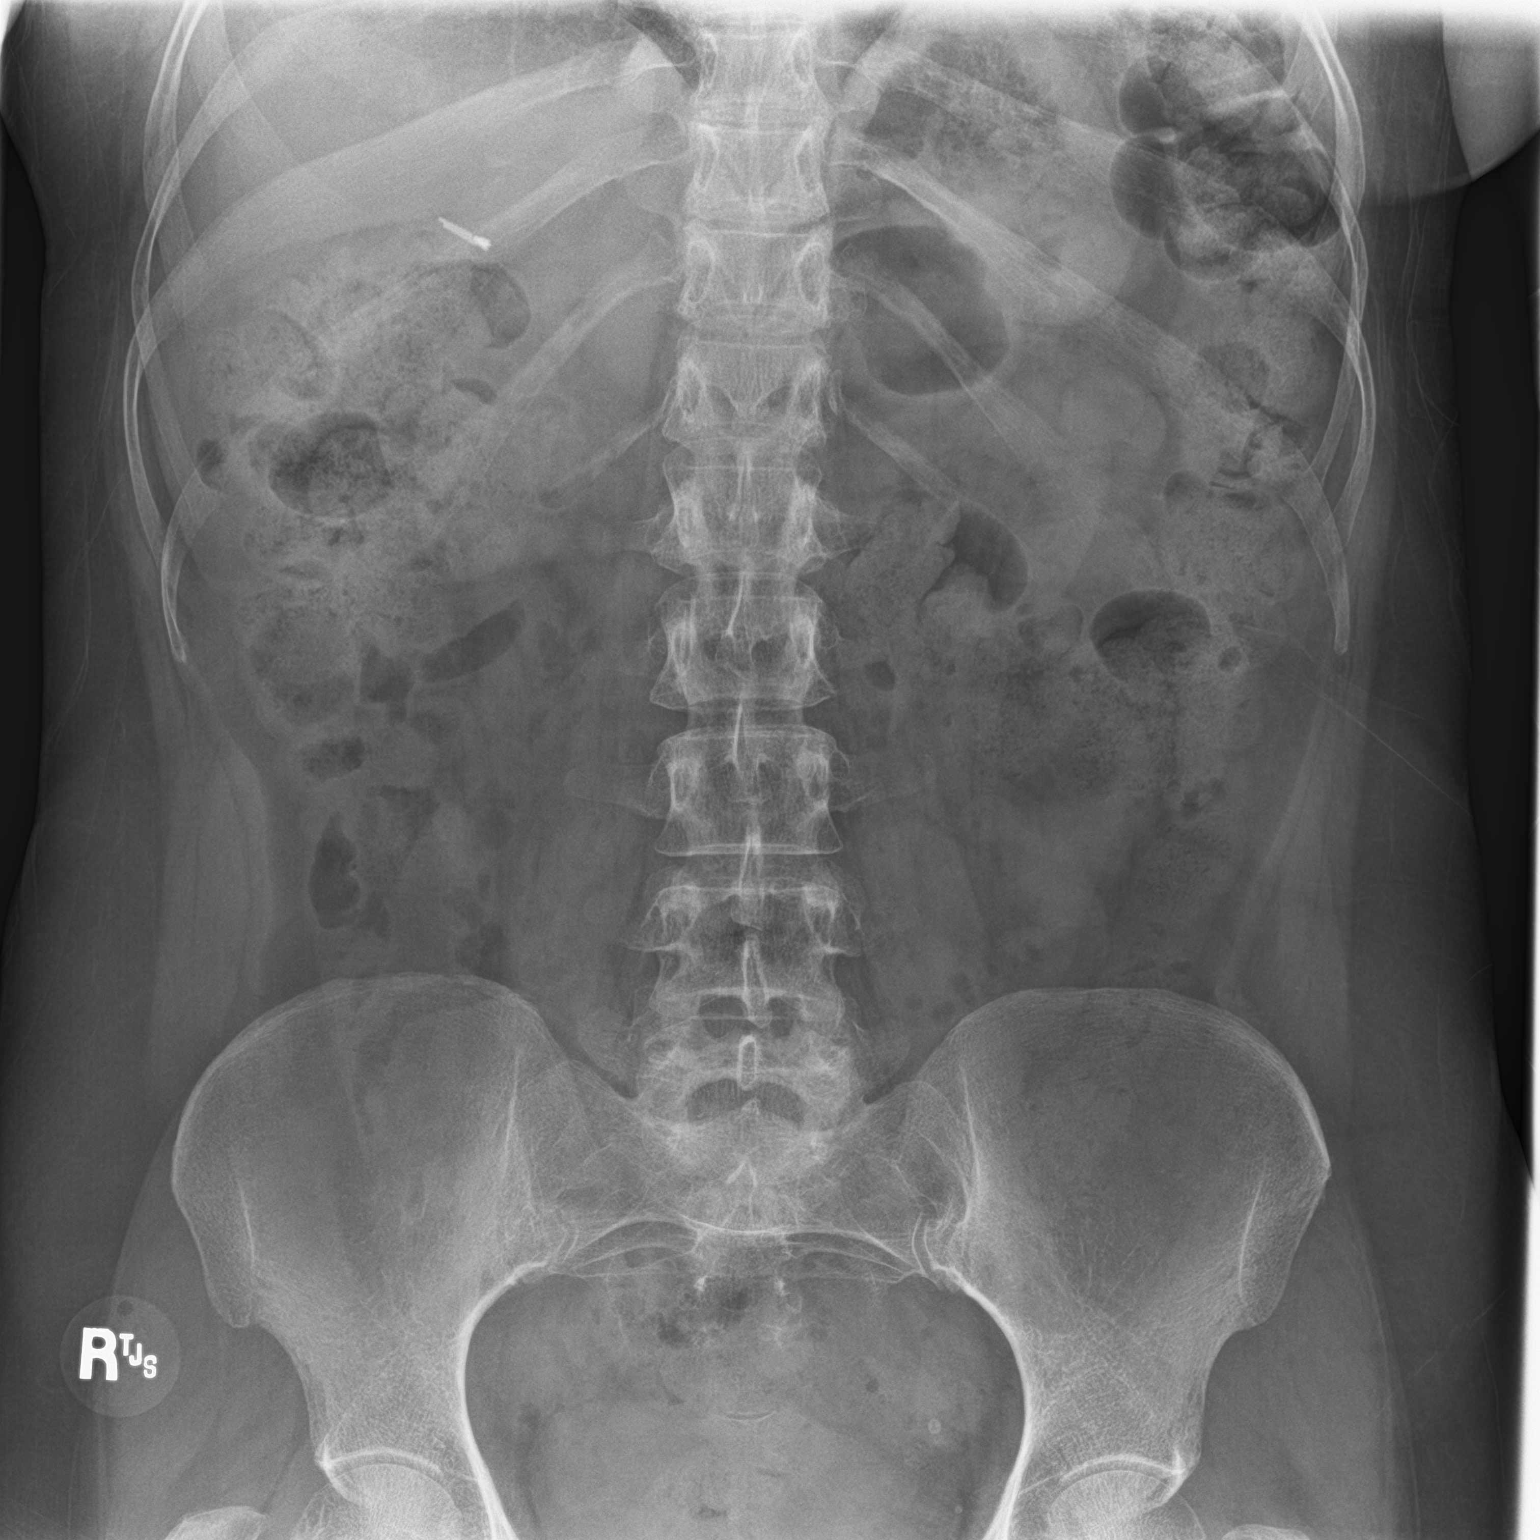
[im 2/2]
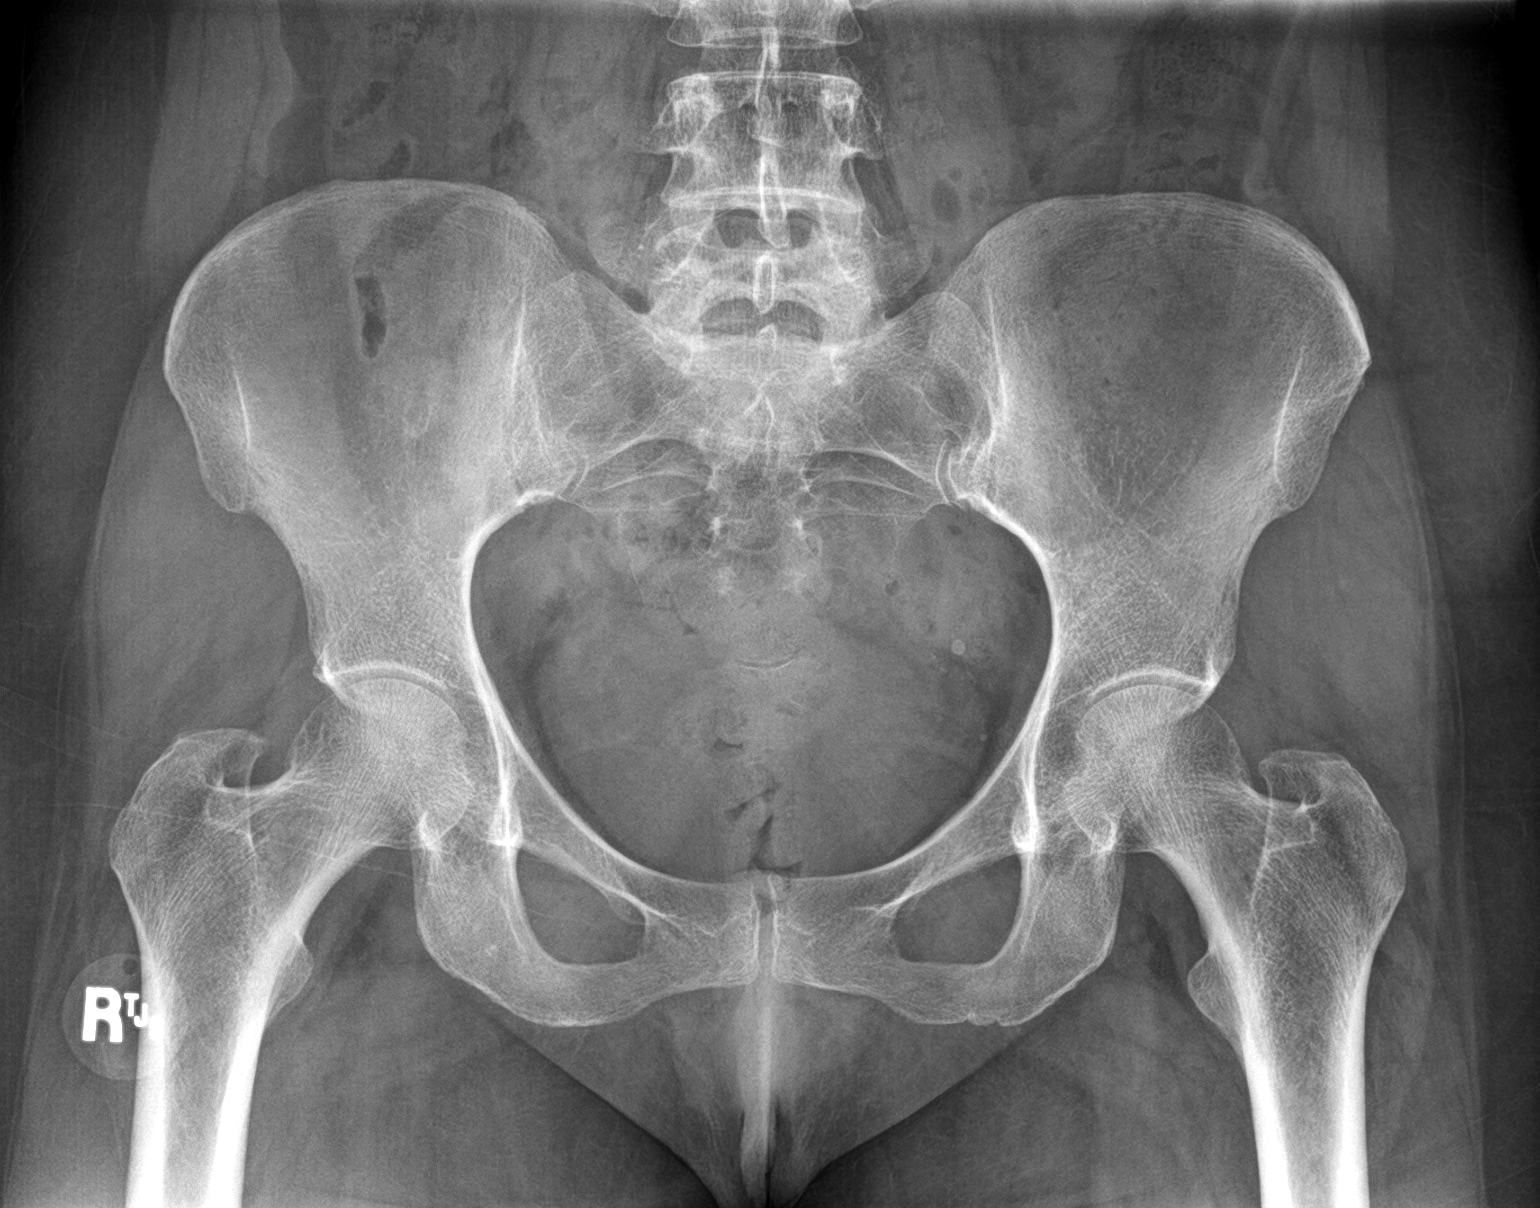

[2 of 2 positions shown; findings below may reference images not displayed]

FINDINGS: Large volume stool throughout the colon. Gas is demonstrated within
nondilated loops of large and small bowel in a nonobstructed
pattern. Osseous structures unremarkable. Pelvic phleboliths.
IMPRESSION: Large volume stool compatible with constipation.

## 2023-06-09 ENCOUNTER — Other Ambulatory Visit: Payer: Self-pay | Admitting: Family Medicine

## 2023-06-09 DIAGNOSIS — F988 Other specified behavioral and emotional disorders with onset usually occurring in childhood and adolescence: Secondary | ICD-10-CM

## 2023-06-14 MED ORDER — AMPHETAMINE-DEXTROAMPHETAMINE 10 MG PO TABS
10.0000 mg | ORAL_TABLET | Freq: Every day | ORAL | 0 refills | Status: DC
Start: 2023-06-14 — End: 2023-07-27

## 2023-06-24 ENCOUNTER — Encounter: Admitting: Family Medicine

## 2023-06-27 ENCOUNTER — Encounter: Payer: Self-pay | Admitting: Family Medicine

## 2023-07-11 ENCOUNTER — Encounter: Payer: Self-pay | Admitting: Family Medicine

## 2023-07-11 NOTE — Telephone Encounter (Signed)
Please see the message below and advise.

## 2023-07-12 ENCOUNTER — Other Ambulatory Visit: Payer: Self-pay | Admitting: Family Medicine

## 2023-07-12 DIAGNOSIS — Z131 Encounter for screening for diabetes mellitus: Secondary | ICD-10-CM

## 2023-07-12 DIAGNOSIS — Z130 Encounter for screening for diseases of the blood and blood-forming organs and certain disorders involving the immune mechanism: Secondary | ICD-10-CM

## 2023-07-12 DIAGNOSIS — Z1159 Encounter for screening for other viral diseases: Secondary | ICD-10-CM

## 2023-07-12 DIAGNOSIS — F988 Other specified behavioral and emotional disorders with onset usually occurring in childhood and adolescence: Secondary | ICD-10-CM

## 2023-07-12 DIAGNOSIS — E785 Hyperlipidemia, unspecified: Secondary | ICD-10-CM

## 2023-07-12 DIAGNOSIS — Z114 Encounter for screening for human immunodeficiency virus [HIV]: Secondary | ICD-10-CM

## 2023-07-12 NOTE — Progress Notes (Signed)
 Labs for CPE scheduled 07/27/23 ordered

## 2023-07-14 ENCOUNTER — Ambulatory Visit
Admission: RE | Admit: 2023-07-14 | Discharge: 2023-07-14 | Disposition: A | Discharge status: Home / Self Care | Source: Ambulatory Visit | Attending: Obstetrics and Gynecology | Admitting: Obstetrics and Gynecology

## 2023-07-14 DIAGNOSIS — E2839 Other primary ovarian failure: Secondary | ICD-10-CM | POA: Diagnosis not present

## 2023-07-14 DIAGNOSIS — M8588 Other specified disorders of bone density and structure, other site: Secondary | ICD-10-CM | POA: Diagnosis not present

## 2023-07-14 DIAGNOSIS — Z780 Asymptomatic menopausal state: Secondary | ICD-10-CM | POA: Diagnosis not present

## 2023-07-20 DIAGNOSIS — Z114 Encounter for screening for human immunodeficiency virus [HIV]: Secondary | ICD-10-CM | POA: Diagnosis not present

## 2023-07-20 DIAGNOSIS — Z1159 Encounter for screening for other viral diseases: Secondary | ICD-10-CM | POA: Diagnosis not present

## 2023-07-20 DIAGNOSIS — F988 Other specified behavioral and emotional disorders with onset usually occurring in childhood and adolescence: Secondary | ICD-10-CM | POA: Diagnosis not present

## 2023-07-20 DIAGNOSIS — Z130 Encounter for screening for diseases of the blood and blood-forming organs and certain disorders involving the immune mechanism: Secondary | ICD-10-CM | POA: Diagnosis not present

## 2023-07-20 DIAGNOSIS — Z131 Encounter for screening for diabetes mellitus: Secondary | ICD-10-CM | POA: Diagnosis not present

## 2023-07-20 DIAGNOSIS — E785 Hyperlipidemia, unspecified: Secondary | ICD-10-CM | POA: Diagnosis not present

## 2023-07-21 ENCOUNTER — Ambulatory Visit: Payer: Self-pay | Admitting: Family Medicine

## 2023-07-21 LAB — CBC
Hematocrit: 45.7 % (ref 34.0–46.6)
Hemoglobin: 15.1 g/dL (ref 11.1–15.9)
MCH: 32.2 pg (ref 26.6–33.0)
MCHC: 33 g/dL (ref 31.5–35.7)
MCV: 97 fL (ref 79–97)
Platelets: 232 10*3/uL (ref 150–450)
RBC: 4.69 x10E6/uL (ref 3.77–5.28)
RDW: 12.6 % (ref 11.7–15.4)
WBC: 5.9 10*3/uL (ref 3.4–10.8)

## 2023-07-21 LAB — LIPID PANEL
Chol/HDL Ratio: 4 ratio (ref 0.0–4.4)
Cholesterol, Total: 259 mg/dL — ABNORMAL HIGH (ref 100–199)
HDL: 65 mg/dL (ref >39)
LDL Chol Calc (NIH): 178 mg/dL — ABNORMAL HIGH (ref 0–99)
Triglycerides: 93 mg/dL (ref 0–149)
VLDL Cholesterol Cal: 16 mg/dL (ref 5–40)

## 2023-07-21 LAB — HEMOGLOBIN A1C
Est. average glucose Bld gHb Est-mCnc: 100 mg/dL
Hgb A1c MFr Bld: 5.1 % (ref 4.8–5.6)

## 2023-07-21 LAB — BMP8+EGFR
BUN/Creatinine Ratio: 16 (ref 12–28)
BUN: 14 mg/dL (ref 8–27)
CO2: 20 mmol/L (ref 20–29)
Calcium: 9.8 mg/dL (ref 8.7–10.3)
Chloride: 102 mmol/L (ref 96–106)
Creatinine, Ser: 0.86 mg/dL (ref 0.57–1.00)
Glucose: 84 mg/dL (ref 70–99)
Potassium: 4.7 mmol/L (ref 3.5–5.2)
Sodium: 140 mmol/L (ref 134–144)
eGFR: 76 mL/min/{1.73_m2} (ref >59)

## 2023-07-21 LAB — HEPATITIS C ANTIBODY: Hep C Virus Ab: NONREACTIVE

## 2023-07-21 LAB — HIV ANTIBODY (ROUTINE TESTING W REFLEX): HIV Screen 4th Generation wRfx: NONREACTIVE

## 2023-07-25 DIAGNOSIS — L821 Other seborrheic keratosis: Secondary | ICD-10-CM | POA: Diagnosis not present

## 2023-07-25 DIAGNOSIS — L900 Lichen sclerosus et atrophicus: Secondary | ICD-10-CM | POA: Diagnosis not present

## 2023-07-25 DIAGNOSIS — L814 Other melanin hyperpigmentation: Secondary | ICD-10-CM | POA: Diagnosis not present

## 2023-07-25 DIAGNOSIS — D485 Neoplasm of uncertain behavior of skin: Secondary | ICD-10-CM | POA: Diagnosis not present

## 2023-07-27 ENCOUNTER — Ambulatory Visit (INDEPENDENT_AMBULATORY_CARE_PROVIDER_SITE_OTHER): Admitting: Family Medicine

## 2023-07-27 ENCOUNTER — Encounter: Payer: Self-pay | Admitting: Family Medicine

## 2023-07-27 VITALS — BP 110/85 | HR 79 | Ht 62.0 in | Wt 135.3 lb

## 2023-07-27 DIAGNOSIS — Z130 Encounter for screening for diseases of the blood and blood-forming organs and certain disorders involving the immune mechanism: Secondary | ICD-10-CM | POA: Diagnosis not present

## 2023-07-27 DIAGNOSIS — Z0000 Encounter for general adult medical examination without abnormal findings: Secondary | ICD-10-CM

## 2023-07-27 DIAGNOSIS — Z131 Encounter for screening for diabetes mellitus: Secondary | ICD-10-CM | POA: Diagnosis not present

## 2023-07-27 DIAGNOSIS — F988 Other specified behavioral and emotional disorders with onset usually occurring in childhood and adolescence: Secondary | ICD-10-CM

## 2023-07-27 DIAGNOSIS — E785 Hyperlipidemia, unspecified: Secondary | ICD-10-CM | POA: Diagnosis not present

## 2023-07-27 MED ORDER — AMPHETAMINE-DEXTROAMPHETAMINE 10 MG PO TABS: 10.0000 mg | ORAL_TABLET | Freq: Every day | ORAL | 0 refills | Status: DC

## 2023-07-27 NOTE — Progress Notes (Signed)
 Complete physical exam   Patient: Kathy Barnes   DOB: 03-04-1960   63 y.o. Female  MRN: [id number] Visit Date: 07/27/2023  Today's healthcare provider: Mimi Alt, MD   Chief Complaint  Patient presents with   Annual Exam    Last completed 06/24/22 Diet -  low fat low carb and sugar with high protein Exercise - spin class 3 days a week for 50 minutes and pilates 2 days a week for 50 minutes Feeling - well Sleeping - well Concerns - none    Care Management    Tetanus - prefer to update after vacation   Subjective    Kathy Barnes is a 63 y.o. female who presents today for a complete physical exam.   Discussed the use of AI scribe software for clinical note transcription with the patient, who gave verbal consent to proceed.  History of Present Illness Kathy Barnes is a 63 year old female who presents for an annual physical exam.  She maintains a low carb, low sugar diet with high protein intake and engages in regular physical activity, including spin classes three times a week and Pilates twice a week. Her BMI is 24.75. Recent lab work showed a normal A1c of 5.1, normal thyroid  function, and normal blood pressure. However, her cholesterol level was elevated at 259 mg/dL, with a good HDL level but elevated LDL. She has a family history of hyperlipidemia and recently had a calcium score of zero. She consumes salmon once a week, uses olive oil, and limits red meat intake.  She has a history of osteopenia and has started taking vitamin D. A recent bone density test indicated some weakness in bone structure but not osteoporosis.  She was diagnosed with lichen sclerosus and was prescribed a steroid cream. She currently has no symptoms such as itching or pain. A recent dermatology visit confirmed the condition is not active, and she uses the cream once a week.  She takes ADHD medication primarily on Mondays and Tuesdays, which are her clinical days, and occasionally  on other days if needed for work. Her prescription typically lasts longer than 30 days.     Past Medical History:  Diagnosis Date   Abnormal uterine bleeding    Anxiety    manage without medications.   Fibromyalgia    Fibromyalgia 06/15/2021   GERD (gastroesophageal reflux disease) 05/03/2014   Prev hx reflux:  Saw functional pharmacist in February 2024. Did significant diet change - lost ten pounds, no more reflux and sleeps better.     History of uterine fibroid 02/22/2009   Psoriasis    Vertigo    last episode approx 12 yrs ago   Past Surgical History:  Procedure Laterality Date   ANKLE SURGERY     BIOPSY ENDOMETRIAL     CHOLECYSTECTOMY     COLONOSCOPY WITH PROPOFOL  N/A 12/13/2014   Procedure: COLONOSCOPY WITH PROPOFOL ;  Surgeon: Marnee Sink, MD;  Location: [facility];  Service: Endoscopy;  Laterality: N/A;   COLONOSCOPY WITH PROPOFOL  N/A 05/13/2021   Procedure: COLONOSCOPY WITH PROPOFOL ;  Surgeon: Selena Daily, MD;  Location: [facility];  Service: Gastroenterology;  Laterality: N/A;   DILATION AND CURETTAGE OF UTERUS     ESOPHAGOGASTRODUODENOSCOPY (EGD) WITH PROPOFOL  N/A 12/13/2014   Procedure: ESOPHAGOGASTRODUODENOSCOPY (EGD) WITH PROPOFOL ;  Surgeon: Marnee Sink, MD;  Location: [facility];  Service: Endoscopy;  Laterality: N/A;   FOOT SURGERY Left    HERNIA REPAIR  POLYPECTOMY  12/13/2014   Procedure: POLYPECTOMY;  Surgeon: Marnee Sink, MD;  Location: [facility];  Service: Endoscopy;;   TONSILLECTOMY     TUBAL LIGATION     WISDOM TOOTH EXTRACTION     x4   Social History   Socioeconomic History   Marital status: Married    Spouse name: Not on file   Number of children: Not on file   Years of education: Not on file   Highest education level: Master's degree (e.g., MA, MS, MEng, MEd, MSW, MBA)  Occupational History   Not on file  Tobacco Use   Smoking status: Never   Smokeless tobacco: Never  Vaping Use   Vaping status:  Never Used  Substance and Sexual Activity   Alcohol use: No   Drug use: No   Sexual activity: Yes    Partners: Male    Birth control/protection: Post-menopausal  Other Topics Concern   Not on file  Social History Narrative   Not on file   Social Drivers of Health   Financial Resource Strain: Low Risk  (06/24/2023)   Overall Financial Resource Strain (CARDIA)    Difficulty of Paying Living Expenses: Not hard at all  Food Insecurity: No Food Insecurity (06/24/2023)   Hunger Vital Sign    Worried About Running Out of Food in the Last Year: Never true    Ran Out of Food in the Last Year: Never true  Transportation Needs: No Transportation Needs (06/24/2023)   PRAPARE - Administrator, Civil Service (Medical): No    Lack of Transportation (Non-Medical): No  Physical Activity: Sufficiently Active (06/24/2023)   Exercise Vital Sign    Days of Exercise per Week: 5 days    Minutes of Exercise per Session: 50 min  Stress: No Stress Concern Present (06/24/2023)   [employer] of Occupational Health - Occupational Stress Questionnaire    Feeling of Stress : Only a little  Social Connections: Socially Integrated (06/24/2023)   Social Connection and Isolation Panel [NHANES]    Frequency of Communication with Friends and Family: More than three times a week    Frequency of Social Gatherings with Friends and Family: Three times a week    Attends Religious Services: More than 4 times per year    Active Member of Clubs or Organizations: Yes    Attends Engineer, structural: More than 4 times per year    Marital Status: Married  Catering manager Violence: Not on file   Family Status  Relation Name Status   Mother  Alive   Father  Alive   MGM  Deceased   MGF  Deceased   PGM  Deceased   PGF  Deceased   Sister  Alive   Brother  Alive   Daughter  Alive   Son  Alive   Brother  Alive   Daughter  Alive  No partnership data on file   Family History  Problem Relation Age of  Onset   Hypertension Mother    Arthritis Mother    Hypertension Father    Asthma Father    Hyperlipidemia Father    Diabetes Maternal Grandmother    Cancer Maternal Grandmother        kidney   Stroke Maternal Grandfather    Diabetes Paternal Grandmother    Heart disease Paternal Grandfather    ADD / ADHD Son    Hypertension Brother    Allergies  Allergen Reactions   Ciprofloxacin  Leg pain    Sulfa Antibiotics Other (See Comments)     Medications: Outpatient Medications Prior to Visit  Medication Sig   clobetasol ointment (TEMOVATE) 0.05 % Apply 1 Application topically daily.   MULTIPLE VITAMIN PO Take by mouth.   [DISCONTINUED] amphetamine -dextroamphetamine  (ADDERALL) 10 MG tablet Take 1 tablet (10 mg total) by mouth daily.   [DISCONTINUED] fluticasone  (FLONASE ) 50 MCG/ACT nasal spray Place 2 sprays into both nostrils daily.   No facility-administered medications prior to visit.    Review of Systems  Last CBC Lab Results  Component Value Date   WBC 5.9 07/20/2023   HGB 15.1 07/20/2023   HCT 45.7 07/20/2023   MCV 97 07/20/2023   MCH 32.2 07/20/2023   RDW 12.6 07/20/2023   PLT 232 07/20/2023   Last metabolic panel Lab Results  Component Value Date   GLUCOSE 84 07/20/2023   NA 140 07/20/2023   K 4.7 07/20/2023   CL 102 07/20/2023   CO2 20 07/20/2023   BUN 14 07/20/2023   CREATININE 0.86 07/20/2023   EGFR 76 07/20/2023   CALCIUM 9.8 07/20/2023   PROT 6.5 06/17/2022   ALBUMIN 4.4 06/17/2022   LABGLOB 2.1 06/17/2022   AGRATIO 2.1 06/17/2022   BILITOT 0.4 06/17/2022   ALKPHOS 55 06/17/2022   AST 17 06/17/2022   ALT 13 06/17/2022   Last lipids Lab Results  Component Value Date   CHOL 259 (H) 07/20/2023   HDL 65 07/20/2023   LDLCALC 178 (H) 07/20/2023   TRIG 93 07/20/2023   CHOLHDL 4.0 07/20/2023   The 10-year ASCVD risk score (Arnett DK, et al., 2019) is: 3.6%  Last hemoglobin A1c Lab Results  Component Value Date   HGBA1C 5.1 07/20/2023    Last thyroid  functions Lab Results  Component Value Date   TSH 1.550 02/12/2021       Objective    BP 110/85 (BP Location: Left Arm, Patient Position: Sitting, Cuff Size: Normal)   Pulse 79   Ht 5\' 2"  (1.575 m)   Wt 135 lb 4.8 oz (61.4 kg)   LMP 07/23/2009   SpO2 99%   BMI 24.75 kg/m  BP Readings from Last 3 Encounters:  07/27/23 110/85  12/27/22 124/80  09/08/22 (!) 132/91   Wt Readings from Last 3 Encounters:  07/27/23 135 lb 4.8 oz (61.4 kg)  12/27/22 142 lb 4.8 oz (64.5 kg)  09/08/22 133 lb (60.3 kg)        Physical Exam Vitals reviewed.  Constitutional:      General: She is not in acute distress.    Appearance: Normal appearance. She is not ill-appearing, toxic-appearing or diaphoretic.  HENT:     Head: Normocephalic and atraumatic.     Right Ear: Tympanic membrane and external ear normal. There is no impacted cerumen.     Left Ear: Tympanic membrane and external ear normal. There is no impacted cerumen.     Nose: Nose normal.     Mouth/Throat:     Pharynx: Oropharynx is clear.  Eyes:     General: No scleral icterus.    Extraocular Movements: Extraocular movements intact.     Conjunctiva/sclera: Conjunctivae normal.     Pupils: Pupils are equal, round, and reactive to light.  Cardiovascular:     Rate and Rhythm: Normal rate and regular rhythm.     Pulses: Normal pulses.     Heart sounds: Normal heart sounds. No murmur heard.    No friction rub. No gallop.  Pulmonary:  Effort: Pulmonary effort is normal. No respiratory distress.     Breath sounds: Normal breath sounds. No wheezing, rhonchi or rales.  Abdominal:     General: Bowel sounds are normal. There is no distension.     Palpations: Abdomen is soft. There is no mass.     Tenderness: There is no abdominal tenderness. There is no guarding.  Musculoskeletal:        General: No deformity.     Cervical back: Normal range of motion and neck supple.     Right lower leg: No edema.     Left lower  leg: No edema.  Lymphadenopathy:     Cervical: No cervical adenopathy.  Skin:    General: Skin is warm.     Capillary Refill: Capillary refill takes less than 2 seconds.     Findings: No erythema or rash.  Neurological:     General: No focal deficit present.     Mental Status: She is alert and oriented to person, place, and time.     Cranial Nerves: Cranial nerves 2-12 are intact. No cranial nerve deficit or facial asymmetry.     Motor: Motor function is intact. No weakness.     Gait: Gait normal.  Psychiatric:        Mood and Affect: Mood normal.        Behavior: Behavior normal.       Last depression screening scores    12/27/2022    2:25 PM 09/08/2022   10:50 AM 06/24/2022    8:51 AM  PHQ 2/9 Scores  PHQ - 2 Score 0 0 0  PHQ- 9 Score  1 0    Last fall risk screening    12/27/2022    2:25 PM  Fall Risk   Falls in the past year? 0  Number falls in past yr: 0  Injury with Fall? 0  Risk for fall due to : No Fall Risks  Follow up Falls evaluation completed    Last Audit-C alcohol use screening    06/24/2023    8:50 AM  Alcohol Use Disorder Test (AUDIT)  1. How often do you have a drink containing alcohol? 0  3. How often do you have six or more drinks on one occasion? 0   A score of 3 or more in women, and 4 or more in men indicates increased risk for alcohol abuse, EXCEPT if all of the points are from question 1   No results found for any visits on 07/27/23.  Assessment & Plan    Routine Health Maintenance and Physical Exam  Immunization History  Administered Date(s) Administered   Influenza Split 01/22/2006, 12/23/2011   Influenza Whole 11/15/2015   Influenza,inj,Quad PF,6+ Mos 11/27/2013, 12/26/2017, 01/12/2022   Influenza-Unspecified 01/12/2020, 12/01/2020, 12/13/2022   Moderna Sars-Covid-2 Vaccination 03/07/2019, 04/04/2019, 03/07/2020   Td 08/07/2004, 08/13/2010   Tdap 10/22/2010   Unspecified SARS-COV-2 Vaccination 04/08/2020   Zoster  Recombinant(Shingrix ) 12/19/2020, 01/12/2022    Health Maintenance  Topic Date Due   DTaP/Tdap/Td (4 - Td or Tdap) 10/21/2020   COVID-19 Vaccine (5 - 2024-25 season) 10/24/2022   INFLUENZA VACCINE  09/23/2023   MAMMOGRAM  10/12/2023   Cervical Cancer Screening (HPV/Pap Cotest)  06/23/2024   Colonoscopy  05/14/2031   Hepatitis C Screening  Completed   HIV Screening  Completed   Zoster Vaccines- Shingrix   Completed   HPV VACCINES  Aged Out   Meningococcal B Vaccine  Aged Out    Problem List Items Addressed  This Visit       Other   HLD (hyperlipidemia)   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 No acute concerns. Psoriasis, hyperlipidemia, ADD, and osteopenia are present. Engages in regular exercise and follows a low-carb, low-sugar, high-protein diet. BMI is 24.75. - Order CMP, CBC, and lipid panel - Defer A1c as it was recently checked  Hyperlipidemia Recent lipid panel: total cholesterol 259 mg/dL, LDL 78 mg/dL, HDL 65 mg/dL, triglycerides not elevated. Familial hyperlipidemia history. 10-year ASCVD risk score is 3.6% (low). Current lifestyle modifications are appropriate. Medication may be considered if LDL reaches 190 mg/dL or higher. - Monitor lipid levels every 6 months - Continue current diet and exercise regimen  Osteopenia Diagnosed with osteopenia based on recent bone density test. No osteoporosis. Taking vitamin D and calcium supplements. Engages in regular weight-bearing exercise. Plan  to monitor bone density in two years unless symptoms progress. Fosamax may be considered if progression is noted. - Continue vitamin D and calcium supplementation - Continue regular exercise - Repeat bone density test in two years  Lichen Sclerosus Diagnosed with lichen sclerosus, currently inactive. Dermatologist recommended using steroid cream once a week. No symptoms of itching or pain. Discussed potential for progression to skin changes or cancer, though rare with regular monitoring and treatment. - Continue using steroid cream once a week - Monitor for any changes in symptoms - Continue annual skin checks  General Health Maintenance Up to date with preventive screenings including mammogram and skin checks. Tetanus vaccine is due but she prefers to delay until after vacation. Engages in regular physical activity and maintains a healthy diet. - Administer tetanus vaccine after vacation - Encourage continuation of current exercise regimen  Follow-up Plans to follow up in six months to monitor cholesterol levels and overall health status. Prescription for ADHD medication (Adderall) to be refilled as needed. - Schedule follow-up appointment in six months - Send Adderall prescription to [facility] in [place]       Return in about 6 months (around 01/26/2024).       Mimi Alt, MD  [facility] [phone] (phone) [phone] (fax)  [facility]

## 2023-07-27 NOTE — Assessment & Plan Note (Signed)
 Chronic  Stable on Adderall 10mg  daily  Refills provided today  PDMP reviewed and appropriate

## 2023-08-04 ENCOUNTER — Other Ambulatory Visit

## 2023-10-26 ENCOUNTER — Encounter: Payer: Self-pay | Admitting: Family Medicine

## 2023-10-26 DIAGNOSIS — F988 Other specified behavioral and emotional disorders with onset usually occurring in childhood and adolescence: Secondary | ICD-10-CM

## 2023-10-27 MED ORDER — AMPHETAMINE-DEXTROAMPHETAMINE 10 MG PO TABS: 10.0000 mg | ORAL_TABLET | Freq: Every day | ORAL | 0 refills | Status: DC

## 2023-10-27 MED ORDER — AMPHETAMINE-DEXTROAMPHETAMINE 10 MG PO TABS
10.0000 mg | ORAL_TABLET | Freq: Every day | ORAL | 0 refills | Status: DC
Start: 2023-10-27 — End: 2023-12-14

## 2023-12-14 ENCOUNTER — Encounter: Payer: Self-pay | Admitting: Family Medicine

## 2023-12-14 ENCOUNTER — Ambulatory Visit (INDEPENDENT_AMBULATORY_CARE_PROVIDER_SITE_OTHER): Admitting: Family Medicine

## 2023-12-14 VITALS — BP 123/84 | HR 80 | Temp 98.6°F | Ht 62.0 in | Wt 133.9 lb

## 2023-12-14 DIAGNOSIS — E559 Vitamin D deficiency, unspecified: Secondary | ICD-10-CM | POA: Diagnosis not present

## 2023-12-14 DIAGNOSIS — F5101 Primary insomnia: Secondary | ICD-10-CM

## 2023-12-14 DIAGNOSIS — M797 Fibromyalgia: Secondary | ICD-10-CM | POA: Diagnosis not present

## 2023-12-14 DIAGNOSIS — F988 Other specified behavioral and emotional disorders with onset usually occurring in childhood and adolescence: Secondary | ICD-10-CM | POA: Diagnosis not present

## 2023-12-14 MED ORDER — AMPHETAMINE-DEXTROAMPHETAMINE 10 MG PO TABS: 10.0000 mg | ORAL_TABLET | ORAL | 0 refills | Status: AC | PRN

## 2023-12-14 MED ORDER — TRAZODONE HCL 50 MG PO TABS: 25.0000 mg | ORAL_TABLET | Freq: Every evening | ORAL | 3 refills | Status: AC | PRN

## 2023-12-14 NOTE — Progress Notes (Signed)
 Established patient visit   Patient: Kathy Barnes   DOB: 31-Mar-1960   63 y.o. Female  MRN: [id number] Visit Date: 12/14/2023  Today's healthcare provider: LAURAINE LOISE BUOY, DO   Chief Complaint  Patient presents with   Insomnia    Patient is here today because she is wanting to discuss not being able to sleep, she is only getting about 6 hours of sleep a night.  Seems like it does have something to do with the weather and her fibromyalgia is seeming to acting up.    All vaccines declined.  Mammogram thinks she had one in January.   Subjective    HPI Kathy Barnes is a 63 year old female with fibromyalgia who presents with concerns about sleep disturbances and fibromyalgia flare-ups.  She has a long-standing history of fibromyalgia, diagnosed at age 51 following gallbladder surgery. Her flare-ups typically occur in August or September, and she notices that extreme heat and air conditioning are associated with these episodes. The current flare-up has persisted for almost three weeks, with pain primarily in her legs, and to a lesser extent in her arms, wrists, and fingers. She has attempted massage and heating for relief. Cold air exacerbates her pain.  Her sleep pattern involves going to bed around 9 PM and waking up at 3:30 AM, totaling about six and a half hours of sleep. She wakes up twice during the night to urinate but returns to sleep quickly. Upon waking at 3:30 AM, she experiences a 'cortisol kind of rush', sometimes accompanied by anxiety. Despite waking early, she does not feel tired during the day but struggles to stay awake past 8 PM.  Her physical activity in the past couple months has been limited due to the fibromyalgia flare-up. She usually participates in spin classes twice a week and Pilates three times a week but has not done Pilates in two weeks and spin in a month.  She has been taking microdoses of tirzepatide for the past seven to eight months and uses Adderall  two to three times a week, primarily for work-related tasks. She has a history of low vitamin D and is currently taking magnesium glycinate and vitamin D supplements.  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      Medications: Outpatient Medications Prior to Visit  Medication Sig   cholecalciferol (VITAMIN D3) 25 MCG (1000 UNIT) tablet Take 1,000 Units by mouth daily.   clobetasol ointment (TEMOVATE) 0.05 % Apply 1 Application topically daily.   MULTIPLE VITAMIN PO Take by mouth.   [DISCONTINUED] amphetamine -dextroamphetamine  (ADDERALL) 10 MG tablet Take 1 tablet (10 mg total) by mouth daily. (Patient taking differently: Take 10 mg by mouth as needed.)   [DISCONTINUED] amphetamine -dextroamphetamine  (ADDERALL) 10 MG tablet Take 1 tablet (10 mg total) by mouth daily.   No facility-administered medications prior to visit.        Objective    BP 123/84 (BP Location: Right Arm, Patient Position: Sitting, Cuff Size: Normal)   Pulse 80   Temp 98.6 F (37 C) (Oral)   Ht 5' 2 (1.575 m)   Wt 133 lb 14.4 oz (60.7 kg)   LMP 07/23/2009   SpO2 99%   BMI 24.49 kg/m     Physical Exam Vitals and nursing note reviewed.  Constitutional:      General: She  is not in acute distress.    Appearance: Normal appearance.  HENT:     Head: Normocephalic and atraumatic.  Eyes:     General: No scleral icterus.    Conjunctiva/sclera: Conjunctivae normal.  Cardiovascular:     Rate and Rhythm: Normal rate.  Pulmonary:     Effort: Pulmonary effort is normal.  Neurological:     Mental Status: She is alert and oriented to person, place, and time. Mental status is at baseline.  Psychiatric:        Mood and Affect: Mood normal.        Behavior: Behavior normal.      No results found for any visits on 12/14/23.  Assessment & Plan    Fibromyalgia -      traZODone HCl; Take 0.5-1 tablets (25-50 mg total) by mouth at bedtime as needed for sleep.  Dispense: 30 tablet; Refill: 3  Primary insomnia -     traZODone HCl; Take 0.5-1 tablets (25-50 mg total) by mouth at bedtime as needed for sleep.  Dispense: 30 tablet; Refill: 3  Attention deficit disorder (ADD) without hyperactivity -     Amphetamine -Dextroamphetamine ; Take 1 tablet (10 mg total) by mouth as needed.  Dispense: 30 tablet; Refill: 0  Vitamin D deficiency     Fibromyalgia; primary insomnia Chronic fibromyalgia exacerbated by cold air, affecting legs, arms, and wrists, with sleep disturbance due to early awakening. - Consider trazodone 25 mg as needed for sleep, increase to 100 mg if necessary. - Encourage aerobic activities like Pilates and spin classes. - Continue massage and heat application.  Attention-deficit hyperactivity disorder without hyperactivity ADHD managed with Adderall 10 mg daily as needed, no symptom changes reported. - Continue current Adderall regimen as needed.  Vitamin D deficiency Vitamin D deficiency managed with supplementation. - Continue vitamin D supplementation.  Recheck at follow up.     Return in about 4 months (around 04/15/2024) for Chronic f/u.      I discussed the assessment and treatment plan with the patient  The patient was provided an opportunity to ask questions and all were answered. The patient agreed with the plan and demonstrated an understanding of the instructions.   The patient was advised to call back or seek an in-person evaluation if the symptoms worsen or if the condition fails to improve as anticipated.    LAURAINE LOISE BUOY, DO  [facility] [phone] (phone) [phone] (fax)  [facility]

## 2024-01-10 DIAGNOSIS — M5412 Radiculopathy, cervical region: Secondary | ICD-10-CM | POA: Diagnosis not present

## 2024-01-23 ENCOUNTER — Encounter: Payer: Self-pay | Admitting: Family Medicine
